# Patient Record
Sex: Female | Born: 1990 | Race: Black or African American | Hispanic: No | Marital: Single
Health system: Midwestern US, Community
[De-identification: ages and names within clinical notes are randomized; demographics above are authoritative.]

## PROBLEM LIST (undated history)

## (undated) DIAGNOSIS — Z789 Other specified health status: Secondary | ICD-10-CM

## (undated) DIAGNOSIS — O4700 False labor before 37 completed weeks of gestation, unspecified trimester: Secondary | ICD-10-CM

## (undated) HISTORY — PX: TONSILLECTOMY: SUR1361

## (undated) MED ORDER — HYDROCODONE-ACETAMINOPHEN 5 MG-325 MG TAB
5-325 mg | ORAL_TABLET | ORAL | Status: DC | PRN
Start: ? — End: 2013-09-01

## (undated) MED ORDER — PROMETHAZINE 12.5 MG TAB
12.5 mg | ORAL_TABLET | Freq: Four times a day (QID) | ORAL | Status: DC | PRN
Start: ? — End: 2012-12-19

## (undated) MED ORDER — HYDROCODONE-ACETAMINOPHEN 7.5 MG-325 MG TAB
ORAL_TABLET | ORAL | Status: DC | PRN
Start: ? — End: 2013-06-03

## (undated) MED ORDER — METRONIDAZOLE 500 MG TAB
500 mg | ORAL_TABLET | Freq: Two times a day (BID) | ORAL | Status: AC
Start: ? — End: 2012-08-02

## (undated) MED ORDER — IBUPROFEN 400 MG TAB
400 mg | ORAL_TABLET | ORAL | Status: DC | PRN
Start: ? — End: 2013-06-03

## (undated) MED ORDER — CEPHALEXIN 500 MG CAP
500 mg | ORAL_CAPSULE | Freq: Four times a day (QID) | ORAL | Status: AC
Start: ? — End: 2012-08-02

## (undated) MED ORDER — PROMETHAZINE 25 MG RECTAL SUPPOSITORY
25 mg | Freq: Four times a day (QID) | RECTAL | Status: AC | PRN
Start: ? — End: 2012-07-12

## (undated) MED ORDER — TRAMADOL 50 MG TAB
50 mg | ORAL_TABLET | Freq: Four times a day (QID) | ORAL | Status: DC | PRN
Start: ? — End: 2014-10-03

## (undated) MED ORDER — NITROFURANTOIN (25% MACROCRYSTAL FORM) 100 MG CAP
100 mg | ORAL_CAPSULE | Freq: Two times a day (BID) | ORAL | Status: DC
Start: ? — End: 2012-07-26

## (undated) MED ORDER — NITROFURANTOIN (25% MACROCRYSTAL FORM) 100 MG CAP
100 mg | ORAL_CAPSULE | Freq: Two times a day (BID) | ORAL | Status: AC
Start: ? — End: 2013-09-04

## (undated) MED ORDER — NITROFURANTOIN (25% MACROCRYSTAL FORM) 100 MG CAP
100 mg | ORAL_CAPSULE | Freq: Two times a day (BID) | ORAL | Status: AC
Start: ? — End: 2012-09-08

---

## 2012-04-24 LAB — METABOLIC PANEL, COMPREHENSIVE
A-G Ratio: 1 — ABNORMAL LOW (ref 1.2–3.5)
ALT (SGPT): 18 U/L (ref 12–65)
AST (SGOT): 15 U/L (ref 15–37)
Albumin: 3.5 g/dL (ref 3.5–5.0)
Alk. phosphatase: 95 U/L (ref 50–136)
Anion gap: 7 mmol/L (ref 7–16)
BUN: 8 MG/DL (ref 6–23)
Bilirubin, total: 0.4 MG/DL (ref 0.2–1.1)
CO2: 29 MMOL/L (ref 21–32)
Calcium: 8.6 MG/DL (ref 8.3–10.4)
Chloride: 106 MMOL/L (ref 98–107)
Creatinine: 1 MG/DL (ref 0.6–1.0)
GFR est AA: >60 mL/min/{1.73_m2} (ref >60)
GFR est non-AA: >60 mL/min/{1.73_m2} (ref >60)
Globulin: 3.6 g/dL — ABNORMAL HIGH (ref 2.3–3.5)
Glucose: 85 MG/DL (ref 65–100)
Potassium: 3.7 MMOL/L (ref 3.5–5.1)
Protein, total: 7.1 g/dL (ref 6.3–8.2)
Sodium: 142 MMOL/L (ref 136–145)

## 2012-04-24 LAB — CBC WITH AUTOMATED DIFF
ABS. BASOPHILS: 0 10*3/uL (ref 0.0–0.2)
ABS. EOSINOPHILS: 0.1 10*3/uL (ref 0.0–0.8)
ABS. IMM. GRANS.: 0 10*3/uL (ref 0.0–0.5)
ABS. LYMPHOCYTES: 1.2 10*3/uL (ref 0.5–4.6)
ABS. MONOCYTES: 0.6 10*3/uL (ref 0.1–1.3)
ABS. NEUTROPHILS: 9.3 10*3/uL — ABNORMAL HIGH (ref 1.7–8.2)
BASOPHILS: 0 % (ref 0.0–2.0)
EOSINOPHILS: 1 % (ref 0.5–7.8)
HCT: 42.6 % (ref 35.8–46.3)
HGB: 14.3 g/dL (ref 11.7–15.4)
IMMATURE GRANULOCYTES: 0.1 % (ref 0.0–5.0)
LYMPHOCYTES: 11 % — ABNORMAL LOW (ref 13–44)
MCH: 28.4 PG (ref 26.1–32.9)
MCHC: 33.6 g/dL (ref 31.4–35.0)
MCV: 84.7 FL (ref 79.6–97.8)
MONOCYTES: 5 % (ref 4.0–12.0)
MPV: 10.3 FL — ABNORMAL LOW (ref 10.8–14.1)
NEUTROPHILS: 83 % — ABNORMAL HIGH (ref 43–78)
PLATELET: 184 10*3/uL (ref 150–450)
RBC: 5.03 M/uL (ref 4.05–5.25)
RDW: 12.9 % (ref 11.9–14.6)
WBC: 11.2 10*3/uL — ABNORMAL HIGH (ref 4.3–11.1)

## 2012-04-24 LAB — C REACTIVE PROTEIN, QT: C-Reactive protein: 0.6 MG/DL (ref 0.0–0.9)

## 2012-04-24 LAB — WET PREP: Wet prep: 0

## 2012-04-24 LAB — HCG URINE, QL. - POC: Pregnancy test,urine (POC): NEGATIVE

## 2012-04-24 MED ORDER — METRONIDAZOLE 500 MG TAB
500 mg | ORAL | Status: AC
Start: 2012-04-24 — End: 2012-04-24
  Administered 2012-04-24: 16:00:00 via ORAL

## 2012-04-24 MED ORDER — SODIUM CHLORIDE 0.9% BOLUS IV
0.9 % | Freq: Once | INTRAVENOUS | Status: AC
Start: 2012-04-24 — End: 2012-04-24
  Administered 2012-04-24: 14:00:00 via INTRAVENOUS

## 2012-04-24 MED ORDER — KETOROLAC TROMETHAMINE 30 MG/ML INJECTION
30 mg/mL (1 mL) | INTRAMUSCULAR | Status: AC
Start: 2012-04-24 — End: 2012-04-24
  Administered 2012-04-24: 14:00:00 via INTRAVENOUS

## 2012-04-24 MED FILL — METRONIDAZOLE 500 MG TAB: 500 mg | ORAL | Qty: 4

## 2012-04-24 MED FILL — KETOROLAC TROMETHAMINE 30 MG/ML INJECTION: 30 mg/mL (1 mL) | INTRAMUSCULAR | Qty: 1

## 2012-04-24 NOTE — ED Provider Notes (Signed)
I was personally available for consultation in the emergency department.  I have reviewed the chart and agree with the documentation recorded by the MLP, including the assessment, treatment plan, and disposition.  Kateland Leisinger M Ryleigh Buenger, MD

## 2012-04-24 NOTE — ED Notes (Signed)
Pt discharged ambulatory to home. Pt teaching rt discharge and followup instructions provided. Pt voiced understanding.

## 2012-04-24 NOTE — ED Notes (Signed)
Pt states her pain has decreased to 3/10, family is at bedside.

## 2012-04-24 NOTE — ED Notes (Signed)
Pt wheeled to room by tech, hyperventilating c/o pain   Female w/ her and pt's minor child

## 2012-04-24 NOTE — ED Provider Notes (Signed)
HPI Comments: Pt is a 21 yo female who c/o lower suprapubic pain that started last night that has been sharp and intermittent in nature. Denies urinary frequency, hesitancy, urgency, or hematuria. States that she was 'up all night' vomiting. Denies fever. LMP was end of May. Denies other symptoms.        pmhx - tonsillectomy  shx - none    Patient is a 21 y.o. female presenting with abdominal pain. The history is provided by the patient.   Abdominal Pain   This is a new problem. The current episode started 6 to 12 hours ago. The problem occurs constantly. The problem has not changed since onset.The pain is located in the suprapubic region. The quality of the pain is sharp. The pain is at a severity of 10/10. Associated symptoms include nausea and vomiting. Pertinent negatives include no anorexia, no fever, no belching, no diarrhea, no flatus, no hematochezia, no melena, no constipation, no dysuria, no frequency, no hematuria, no headaches, no arthralgias, no myalgias, no trauma, no chest pain, no testicular pain and no back pain.        History reviewed. No pertinent past medical history.     Past Surgical History   Procedure Date   ??? Hx heent      tonsils         History reviewed. No pertinent family history.     History     Social History   ??? Marital Status: SINGLE     Spouse Name: N/A     Number of Children: N/A   ??? Years of Education: N/A     Occupational History   ??? Not on file.     Social History Main Topics   ??? Smoking status: Not on file   ??? Smokeless tobacco: Not on file   ??? Alcohol Use: No   ??? Drug Use: No   ??? Sexually Active: Yes -- Female partner(s)     Birth Control/ Protection: None     Other Topics Concern   ??? Not on file     Social History Narrative   ??? No narrative on file                  ALLERGIES: Review of patient's allergies indicates no known allergies.      Review of Systems   Constitutional: Negative.  Negative for fever, diaphoresis and unexpected weight change.   Respiratory: Negative for  cough and shortness of breath.    Cardiovascular: Negative for chest pain and palpitations.   Gastrointestinal: Positive for nausea, vomiting and abdominal pain. Negative for diarrhea, constipation, melena, hematochezia, anorexia and flatus.   Genitourinary: Negative for dysuria, frequency, hematuria and testicular pain.   Musculoskeletal: Negative for myalgias, back pain and arthralgias.   Skin: Negative for color change and pallor.   Neurological: Negative for headaches.   Psychiatric/Behavioral: Negative.  Negative for behavioral problems, confusion, self-injury and agitation.   All other systems reviewed and are negative.        Filed Vitals:    04/24/12 0922   BP: 124/59   Pulse: 64   Temp: 98.1 ??F (36.7 ??C)   Resp: 18   Height: 5\' 9"  (1.753 m)   Weight: 90.719 kg (200 lb)   SpO2: 98%            Physical Exam   Nursing note and vitals reviewed.  Constitutional: She is oriented to person, place, and time. She appears well-developed and well-nourished.  Non-toxic  appearance. She does not have a sickly appearance. She does not appear ill. No distress.   HENT:   Head: Normocephalic and atraumatic.   Eyes: Conjunctivae and EOM are normal. Pupils are equal, round, and reactive to light.   Neck: Normal range of motion. Neck supple.   Cardiovascular: Normal rate, regular rhythm and normal heart sounds.  PMI is not displaced.  Exam reveals no gallop.    No murmur heard.  Pulmonary/Chest: Effort normal and breath sounds normal. No respiratory distress. She has no decreased breath sounds. She has no wheezes. She has no rhonchi. She has no rales.   Abdominal: Soft. Normal appearance and bowel sounds are normal. She exhibits no distension, no fluid wave, no ascites and no mass. There is tenderness in the suprapubic area. There is no rigidity, no rebound, no guarding, no tenderness at McBurney's point and negative Murphy's sign.       Musculoskeletal: Normal range of motion. She exhibits no edema and no tenderness.    Neurological: She is alert and oriented to person, place, and time.   Skin: Skin is warm and dry. She is not diaphoretic.   Psychiatric: She has a normal mood and affect. Her behavior is normal. Judgment and thought content normal.        MDM    Procedures    Assessment: bv  Plan: flagyl. Fluids. Ibuprofen. Fu with pcp, return if s.sxs worsen or change or new symptoms develop./ no alcohol for 72 hours after taking flagyle. Pt in agreement with assessment and plan.    I have discussed the results of labs, procedures, radiographs, treatments as well as any previous results found within the [facility] [employer] with the patient and available family.?? A treatment plan was developed in conjunction with the patient and was agreed upon. The patient is ready for discharge at this time.?? All voiced understanding of the discharge plan and medication instructions or changes as appropriate.?? Questions about treatment in the ED were answered.?? The patient was encouraged to return should symptoms worsen or new problems develop. A follow up physician was provided to the patient on the discharge papers.

## 2012-04-24 NOTE — ED Notes (Cosign Needed)
Pt seen by Dr. Cyndie Chime. Awaiting wet prep results

## 2012-04-24 NOTE — ED Notes (Signed)
I have reviewed discharge instructions with the patient.  The patient verbalized understanding.

## 2012-04-24 NOTE — ED Notes (Signed)
Iv fluids hung, iv med given, room set up for pelvic exam

## 2012-04-24 NOTE — ED Notes (Signed)
Onset of lower abd pain last evening assoc with some vomiting. Mother stated pt has similar episode of pain at age of 17 that did not show any cause of pain. They wanted to do exploratory surgery but she would now allow. Pt did not return. Pt is feeling better. abd soft non tender.Marland Kitchen

## 2012-04-24 NOTE — ED Notes (Signed)
Pt c/o continued nausea, restless, hyperventilating. Mother comforting pt

## 2012-04-24 NOTE — ED Notes (Cosign Needed)
Pt is a 21 yo female who c/o lower suprapubic pain that started last night that has been sharp and intermittent in nature. Denies urinary frequency, hesitancy, urgency, or hematuria. States that she was 'up all night' vomiting. Denies fever. LMP was end of May. Denies other symptoms.        abd exam: soft abdomen with no guarding, rigidity, rebound, distention. NBS in all quadrants.

## 2012-04-24 NOTE — ED Notes (Signed)
Pt was actively hyperventilating upon arrival. She had associated carpopedal spams. They have resolved. No further vomiting.

## 2012-04-24 NOTE — ED Notes (Cosign Needed)
Results Include:    Recent Results (from the past 24 hour(s))   HCG URINE, QL. - POC    Collection Time    04/24/12  9:32 AM       Component Value Range    Pregnancy test,urine (POC) NEGATIVE   NEGATIVE   CBC WITH AUTOMATED DIFF    Collection Time    04/24/12 10:07 AM       Component Value Range    WBC 11.2 (*) 4.3 - 11.1 K/uL    RBC 5.03  4.05 - 5.25 M/uL    HGB 14.3  11.7 - 15.4 g/dL    HCT 16.1  09.6 - 04.5 %    MCV 84.7  79.6 - 97.8 FL    MCH 28.4  26.1 - 32.9 PG    MCHC 33.6  31.4 - 35.0 g/dL    RDW 40.9  81.1 - 91.4 %    PLATELET 184  150 - 450 K/uL    MPV 10.3 (*) 10.8 - 14.1 FL    DF AUTOMATED      NEUTROPHILS 83 (*) 43 - 78 %    LYMPHOCYTES 11 (*) 13 - 44 %    MONOCYTES 5  4.0 - 12.0 %    EOSINOPHILS 1  0.5 - 7.8 %    BASOPHILS 0  0.0 - 2.0 %    IMMATURE GRANULOCYTES 0.1  0.0 - 5.0 %    ABS. NEUTROPHILS 9.3 (*) 1.7 - 8.2 K/UL    ABS. LYMPHOCYTES 1.2  0.5 - 4.6 K/UL    ABS. MONOCYTES 0.6  0.1 - 1.3 K/UL    ABS. EOSINOPHILS 0.1  0.0 - 0.8 K/UL    ABS. BASOPHILS 0.0  0.0 - 0.2 K/UL    ABS. IMM. GRANS. 0.0  0.0 - 0.5 K/UL   METABOLIC PANEL, COMPREHENSIVE    Collection Time    04/24/12 10:07 AM       Component Value Range    Sodium 142  136 - 145 MMOL/L    Potassium 3.7  3.5 - 5.1 MMOL/L    Chloride 106  98 - 107 MMOL/L    CO2 29  21 - 32 MMOL/L    Anion gap 7  7 - 16 mmol/L    Glucose 85  65 - 100 MG/DL    BUN 8  6 - 23 MG/DL    Creatinine 7.82  0.6 - 1.0 MG/DL    GFR est-AA >95  >62 ml/min/1.49m2    GFR est non-AA >60  >60 ml/min/1.34m2    Calcium 8.6  8.3 - 10.4 MG/DL    Bilirubin, total 0.4  0.2 - 1.1 MG/DL    ALT 18  12 - 65 U/L    AST 15  15 - 37 U/L    Alk. phosphatase 95  50 - 136 U/L    Protein, total 7.1  6.3 - 8.2 g/dL    Albumin 3.5  3.5 - 5.0 g/dL    Globulin 3.6 (*) 2.3 - 3.5 g/dL    A-G Ratio 1.0 (*) 1.2 - 3.5     C REACTIVE PROTEIN, QT    Collection Time    04/24/12 10:07 AM       Component Value Range    C-Reactive protein 0.6  0.0 - 0.9 MG/DL   WET PREP    Collection Time    04/24/12 11:23 AM        Component Value Range    Specimen Description: VAGINA  Special Requests: NO SPECIAL REQUESTS      Wet prep        Value: 0 TO 3 WBC      NO YEAST SEEN      NO TRICHOMONAS SEEN      FEW CLUE CELLS PRESENT    Report Status 04/24/2012 FINAL            Assessment: BV  Plan: metronidazole. Water. Fu with pcp. Return if s.sxs worsen or change.      I have discussed the results of labs, procedures, radiographs, treatments as well as any previous results found within the [facility] [employer] with the patient and available family.?? A treatment plan was developed in conjunction with the patient and was agreed upon. The patient is ready for discharge at this time.?? All voiced understanding of the discharge plan and medication instructions or changes as appropriate.?? Questions about treatment in the ED were answered.?? The patient was encouraged to return should symptoms worsen or new problems develop. A follow up physician was provided to the patient on the discharge papers.

## 2012-04-24 NOTE — ED Notes (Signed)
Seen here today diagnosed with PID put on flagyl and vomited. Pt hyperventallating and hands cramping.

## 2012-04-24 NOTE — ED Provider Notes (Signed)
Patient is a 21 y.o. female presenting with numbness. The history is provided by the patient and medical records.   Numbness  This is a new problem. The current episode started 1 to 2 hours ago. The problem has not changed since onset.There was no focality noted. There has been no fever.    Pt was seen here earlier today for pelvic pain, diagnosed with BV and given 2grams of flagyl x1. She was discharged home and several hours later vomited. She was brought back to the ED hyperventilating and anxious. No further vomiting.     History reviewed. No pertinent past medical history.     Past Surgical History   Procedure Date   ??? Hx heent      tonsils         History reviewed. No pertinent family history.     History     Social History   ??? Marital Status: SINGLE     Spouse Name: N/A     Number of Children: N/A   ??? Years of Education: N/A     Occupational History   ??? Not on file.     Social History Main Topics   ??? Smoking status: Not on file   ??? Smokeless tobacco: Not on file   ??? Alcohol Use: No   ??? Drug Use: No   ??? Sexually Active: Yes -- Female partner(s)     Birth Control/ Protection: None     Other Topics Concern   ??? Not on file     Social History Narrative   ??? No narrative on file                  ALLERGIES: Review of patient's allergies indicates no known allergies.      Review of Systems   Neurological: Positive for numbness.   All other systems reviewed and are negative.        Filed Vitals:    04/24/12 2234 04/24/12 2242 04/24/12 2250 04/24/12 2306   BP:   133/66    Pulse:       Temp:       Resp:   32    Height:       Weight:       SpO2: 100% 100% 100% 100%            Physical Exam   Nursing note and vitals reviewed.  Constitutional: She is oriented to person, place, and time. She appears well-developed and well-nourished.        .Blood pressure 133/66, pulse 80, temperature 96.9 ??F (36.1 ??C), resp. rate 32, height 5\' 10"  (1.778 m), weight 90.719 kg (200 lb), last menstrual period 04/02/2012, SpO2 100.00%.        HENT:   Head: Normocephalic.   Mouth/Throat: Oropharynx is clear and moist.   Eyes: Conjunctivae are normal.   Neck: Neck supple.   Cardiovascular: Normal rate, regular rhythm, normal heart sounds and intact distal pulses.    Pulmonary/Chest: Effort normal and breath sounds normal.   Abdominal: Soft. Bowel sounds are normal. There is no tenderness.   Musculoskeletal: Normal range of motion.   Neurological: She is alert and oriented to person, place, and time.   Skin: Skin is warm and dry.        MDM    Procedures

## 2012-04-25 MED ORDER — ONDANSETRON 4 MG TAB, RAPID DISSOLVE
4 mg | ORAL | Status: AC
Start: 2012-04-25 — End: 2012-04-24
  Administered 2012-04-25: 03:00:00 via ORAL

## 2012-04-25 MED ORDER — ONDANSETRON 4 MG TAB, RAPID DISSOLVE
4 mg | ORAL_TABLET | Freq: Three times a day (TID) | ORAL | Status: DC | PRN
Start: 2012-04-25 — End: 2012-06-24

## 2012-04-25 MED ORDER — METRONIDAZOLE 500 MG TAB
500 mg | ORAL_TABLET | Freq: Two times a day (BID) | ORAL | Status: AC
Start: 2012-04-25 — End: 2012-04-27

## 2012-04-25 MED FILL — ONDANSETRON 4 MG TAB, RAPID DISSOLVE: 4 mg | ORAL | Qty: 1

## 2012-04-26 LAB — CHLAMYDIA / GC-AMPLIFIED
Chlamydia trachomatis, NAA: NEGATIVE
Neisseria gonorrhoeae, NAA: NEGATIVE

## 2012-06-24 LAB — HCG URINE, QL. - POC: Pregnancy test,urine (POC): NEGATIVE

## 2012-06-24 MED ORDER — ONDANSETRON 4 MG TAB, RAPID DISSOLVE
4 mg | ORAL_TABLET | Freq: Three times a day (TID) | ORAL | Status: DC | PRN
Start: 2012-06-24 — End: 2012-07-18

## 2012-06-24 MED ORDER — TRAMADOL 50 MG TAB
50 mg | ORAL_TABLET | Freq: Four times a day (QID) | ORAL | Status: DC | PRN
Start: 2012-06-24 — End: 2012-07-18

## 2012-06-24 MED ADMIN — sodium chloride 0.9 % bolus infusion 1,000 mL: INTRAVENOUS | @ 21:00:00 | NDC 00409798309

## 2012-06-24 MED ADMIN — ondansetron (ZOFRAN) injection 4 mg: INTRAVENOUS | @ 21:00:00 | NDC 00409475503

## 2012-06-24 MED ADMIN — HYDROcodone-acetaminophen (NORCO) 7.5-325 mg per tablet 1 Tab: ORAL | @ 21:00:00 | NDC 00406036662

## 2012-06-24 MED FILL — ONDANSETRON (PF) 4 MG/2 ML INJECTION: 4 mg/2 mL | INTRAMUSCULAR | Qty: 2

## 2012-06-24 MED FILL — HYDROCODONE-ACETAMINOPHEN 7.5 MG-325 MG TAB: ORAL | Qty: 1

## 2012-06-24 NOTE — ED Provider Notes (Signed)
HPI Comments: Pt c/o nausea and generalized abdominal pain after having too much ETOH last night.      Patient is a 21 y.o. female presenting with abdominal pain. The history is provided by the patient.   Abdominal Pain   This is a new problem. The current episode started 12 to 24 hours ago. The problem occurs constantly. The problem has not changed since onset.The pain is associated with vomiting. The pain is located in the generalized abdominal region. The quality of the pain is cramping. The pain is moderate. Associated symptoms include nausea, vomiting and headaches. Pertinent negatives include no fever, no diarrhea, no dysuria, no frequency and no chest pain. Nothing worsens the pain. The pain is relieved by nothing.        No past medical history on file.     Past Surgical History   Procedure Date   ??? Hx heent      tonsils         No family history on file.     History     Social History   ??? Marital Status: SINGLE     Spouse Name: N/A     Number of Children: N/A   ??? Years of Education: N/A     Occupational History   ??? Not on file.     Social History Main Topics   ??? Smoking status: Current Everyday Smoker -- 0.5 packs/day   ??? Smokeless tobacco: Not on file   ??? Alcohol Use: Yes      occ   ??? Drug Use: No   ??? Sexually Active: Yes -- Female partner(s)     Birth Control/ Protection: None     Other Topics Concern   ??? Not on file     Social History Narrative   ??? No narrative on file                  ALLERGIES: Review of patient's allergies indicates no known allergies.      Review of Systems   Constitutional: Positive for activity change and appetite change. Negative for fever, chills and diaphoresis.   Cardiovascular: Negative for chest pain and palpitations.   Gastrointestinal: Positive for nausea, vomiting and abdominal pain. Negative for diarrhea.   Genitourinary: Negative for dysuria, frequency, flank pain, vaginal discharge and pelvic pain.   Neurological: Positive for headaches. Negative for dizziness.        Filed Vitals:    06/24/12 1526   BP: 127/70   Pulse: 59   Temp: 98 ??F (36.7 ??C)   Resp: 18   Height: 5\' 9"  (1.753 m)   Weight: 90.719 kg (200 lb)   SpO2: 98%            Physical Exam   Vitals reviewed.  Constitutional: She is oriented to person, place, and time. She appears well-developed and well-nourished. No distress.   HENT:   Head: Normocephalic and atraumatic.   Eyes: Conjunctivae are normal. Pupils are equal, round, and reactive to light.   Neck: Normal range of motion. Neck supple.   Cardiovascular: Regular rhythm and normal heart sounds.    No murmur heard.  Pulmonary/Chest: Effort normal and breath sounds normal. No respiratory distress. She has no wheezes. She has no rales.   Abdominal: Soft. Normal appearance and bowel sounds are normal. She exhibits no distension and no mass. There is no hepatosplenomegaly. There is generalized tenderness. There is no rigidity, no rebound, no guarding, no CVA tenderness, no tenderness at  McBurney's point and negative Murphy's sign. No hernia.   Neurological: She is alert and oriented to person, place, and time.   Skin: Skin is warm and dry. She is not diaphoretic.   Psychiatric: She has a normal mood and affect.        MDM     Progress:   Patient progress:  Improved (Pt states she feels significantly better after treatment.  )      Procedures    Results Include:    Recent Results (from the past 24 hour(s))   HCG URINE, QL. - POC    Collection Time    06/24/12  3:35 PM       Component Value Range    Pregnancy test,urine (POC) NEGATIVE   NEGATIVE      I have discussed the exam and treatments with the patient and available family.?? A treatment plan was developed in conjunction with the patient and was agreed upon. The patient is ready for discharge at this time.?? All voiced understanding of the discharge plan and medication instructions or changes as appropriate.?? Questions about treatment in the ED were answered.?? The patient was encouraged to return should symptoms  worsen or new problems develop. A follow up physician was provided to the patient on the discharge papers.

## 2012-06-24 NOTE — ED Notes (Signed)
Dc with rx and dc instructions; reviewed with pt; sts understanding. Left with so.

## 2012-06-24 NOTE — ED Notes (Signed)
Pt sts diarrhea onset this am.

## 2012-06-24 NOTE — ED Provider Notes (Signed)
I was personally available for consultation in the emergency department.  I have reviewed the chart and agree with the documentation recorded by the MLP, including the assessment, treatment plan, and disposition.  Rudie Rikard S Dial III, MD

## 2012-07-01 LAB — URINE MICROSCOPIC
Casts: 0 /LPF
Crystals, urine: 0 /LPF
Mucus: 0 /LPF

## 2012-07-01 LAB — CBC WITH AUTOMATED DIFF
ABS. BASOPHILS: 0 10*3/uL (ref 0.0–0.2)
ABS. EOSINOPHILS: 0 10*3/uL (ref 0.0–0.8)
ABS. IMM. GRANS.: 0 10*3/uL (ref 0.0–0.5)
ABS. LYMPHOCYTES: 1.2 10*3/uL (ref 0.5–4.6)
ABS. MONOCYTES: 0.4 10*3/uL (ref 0.1–1.3)
ABS. NEUTROPHILS: 8.7 10*3/uL — ABNORMAL HIGH (ref 1.7–8.2)
BASOPHILS: 0 % (ref 0.0–2.0)
EOSINOPHILS: 0 % — ABNORMAL LOW (ref 0.5–7.8)
HCT: 39.3 % (ref 35.8–46.3)
HGB: 13.5 g/dL (ref 11.7–15.4)
IMMATURE GRANULOCYTES: 0.3 % (ref 0.0–5.0)
LYMPHOCYTES: 12 % — ABNORMAL LOW (ref 13–44)
MCH: 28.6 PG (ref 26.1–32.9)
MCHC: 34.4 g/dL (ref 31.4–35.0)
MCV: 83.3 FL (ref 79.6–97.8)
MONOCYTES: 4 % (ref 4.0–12.0)
MPV: 10.9 FL (ref 10.8–14.1)
NEUTROPHILS: 84 % — ABNORMAL HIGH (ref 43–78)
PLATELET: 189 10*3/uL (ref 150–450)
RBC: 4.72 M/uL (ref 4.05–5.25)
RDW: 13.5 % (ref 11.9–14.6)
WBC: 10.4 10*3/uL (ref 4.3–11.1)

## 2012-07-01 LAB — METABOLIC PANEL, BASIC
Anion gap: 12 mmol/L (ref 7–16)
BUN: 12 MG/DL (ref 6–23)
CO2: 25 MMOL/L (ref 21–32)
Calcium: 8.8 MG/DL (ref 8.3–10.4)
Chloride: 107 MMOL/L (ref 98–107)
Creatinine: 0.9 MG/DL (ref 0.6–1.0)
GFR est AA: >60 mL/min/{1.73_m2} (ref >60)
GFR est non-AA: >60 mL/min/{1.73_m2} (ref >60)
Glucose: 78 MG/DL (ref 65–100)
Potassium: 3.5 MMOL/L (ref 3.5–5.1)
Sodium: 144 MMOL/L (ref 136–145)

## 2012-07-01 LAB — HCG URINE, QL. - POC: Pregnancy test,urine (POC): NEGATIVE

## 2012-07-01 LAB — HCG QL SERUM: HCG, Ql.: NEGATIVE

## 2012-07-01 MED ADMIN — cefTRIAXone (ROCEPHIN) 1 g in 0.9% sodium chloride (MBP/ADV) 50 mL MBP: INTRAVENOUS | @ 16:00:00 | NDC 00781320885

## 2012-07-01 MED ADMIN — ketorolac (TORADOL) injection 30 mg: INTRAVENOUS | @ 16:00:00 | NDC 00409379501

## 2012-07-01 MED ADMIN — sodium chloride 0.9 % bolus infusion 1,000 mL: INTRAVENOUS | @ 16:00:00 | NDC 00409798309

## 2012-07-01 MED FILL — SODIUM CHLORIDE 0.9 % IV PIGGY BACK: INTRAVENOUS | Qty: 50

## 2012-07-01 MED FILL — KETOROLAC TROMETHAMINE 30 MG/ML INJECTION: 30 mg/mL (1 mL) | INTRAMUSCULAR | Qty: 1

## 2012-07-01 NOTE — ED Notes (Signed)
Report given to L Duncan RN

## 2012-07-01 NOTE — ED Notes (Signed)
Pt resting at present friend at bedside,no complaints noted

## 2012-07-01 NOTE — ED Notes (Signed)
Dc with rx and dc instructions; reviewed with pt; sts understanding; left with so.

## 2012-07-01 NOTE — ED Notes (Addendum)
Pt. Feeling better. No c/o voiced.

## 2012-07-01 NOTE — ED Notes (Addendum)
Pt. Wanting to know when she can be d/c'd. MD informed.

## 2012-07-01 NOTE — ED Provider Notes (Addendum)
HPI Comments: Dysuria, dark urine, LA cramps      Patient is a 21 y.o. female presenting with abdominal pain. The history is provided by the patient.   Abdominal Pain   Associated symptoms include dysuria.        History reviewed. No pertinent past medical history.     Past Surgical History   Procedure Date   ??? Hx tonsillectomy          History reviewed. No pertinent family history.     History     Social History   ??? Marital Status: SINGLE     Spouse Name: N/A     Number of Children: N/A   ??? Years of Education: N/A     Occupational History   ??? Not on file.     Social History Main Topics   ??? Smoking status: Current Everyday Smoker -- 0.5 packs/day   ??? Smokeless tobacco: Never Used   ??? Alcohol Use: Yes      occ   ??? Drug Use: No   ??? Sexually Active: Yes -- Female partner(s)     Birth Control/ Protection: None     Other Topics Concern   ??? Not on file     Social History Narrative   ??? No narrative on file                  ALLERGIES: Review of patient's allergies indicates no known allergies.      Review of Systems   Constitutional: Negative.    HENT: Negative.    Respiratory: Negative.    Cardiovascular: Negative.    Gastrointestinal: Positive for abdominal pain.   Genitourinary: Positive for dysuria.   All other systems reviewed and are negative.        Filed Vitals:    07/01/12 1032   BP: 129/79   Pulse: 67   Temp: 98 ??F (36.7 ??C)   Resp: 18   Height: 5\' 9"  (1.753 m)   Weight: 90.719 kg (200 lb)   SpO2: 99%            Physical Exam   Nursing note and vitals reviewed.  Constitutional: She is oriented to person, place, and time. She appears well-developed and well-nourished.   HENT:   Head: Normocephalic and atraumatic.   Eyes: Pupils are equal, round, and reactive to light.   Neck: Normal range of motion. Neck supple.   Cardiovascular: Normal rate and regular rhythm.    Pulmonary/Chest: Effort normal.   Abdominal: Soft. Bowel sounds are normal. She exhibits no distension and no mass. There is no rebound and no guarding.    Musculoskeletal: Normal range of motion.   Neurological: She is alert and oriented to person, place, and time.   Skin: Skin is warm and dry.        MDM     Differential Diagnosis; Clinical Impression; Plan:     Pt feels better in ED          Procedures

## 2012-07-01 NOTE — ED Notes (Signed)
Pt. Sleeping soundly. Boyfriend at bedside.

## 2012-07-01 NOTE — ED Notes (Signed)
Md to room

## 2012-07-04 NOTE — ED Notes (Signed)
PIV initiated, rainbow to lab. Patient unable to provide urine for specimen at this time. IVF infusing with zofran given per Zellner's direct order.

## 2012-07-04 NOTE — ED Notes (Signed)
Patient presents to ED with c/c NVD and chills. Patient reports onset of NVD on Saturday. Patient was diagnosed with UTI though reports she cannot keep her meds down. Patient reports increasing vomiting with small red blood specs in vomit.

## 2012-07-05 LAB — METABOLIC PANEL, COMPREHENSIVE
A-G Ratio: 1 — ABNORMAL LOW (ref 1.2–3.5)
ALT (SGPT): 24 U/L (ref 12–65)
AST (SGOT): 24 U/L (ref 15–37)
Albumin: 3.6 g/dL (ref 3.5–5.0)
Alk. phosphatase: 73 U/L (ref 50–136)
Anion gap: 9 mmol/L (ref 7–16)
BUN: 9 MG/DL (ref 6–23)
Bilirubin, total: 0.5 MG/DL (ref 0.2–1.1)
CO2: 25 MMOL/L (ref 21–32)
Calcium: 8.9 MG/DL (ref 8.3–10.4)
Chloride: 110 MMOL/L — ABNORMAL HIGH (ref 98–107)
Creatinine: 0.8 MG/DL (ref 0.6–1.0)
GFR est AA: >60 mL/min/{1.73_m2} (ref >60)
GFR est non-AA: >60 mL/min/{1.73_m2} (ref >60)
Globulin: 3.6 g/dL — ABNORMAL HIGH (ref 2.3–3.5)
Glucose: 81 MG/DL (ref 65–100)
Potassium: 3.4 MMOL/L — ABNORMAL LOW (ref 3.5–5.1)
Protein, total: 7.2 g/dL (ref 6.3–8.2)
Sodium: 144 MMOL/L (ref 136–145)

## 2012-07-05 LAB — CBC WITH AUTOMATED DIFF
ABS. BASOPHILS: 0 10*3/uL (ref 0.0–0.2)
ABS. EOSINOPHILS: 0.1 10*3/uL (ref 0.0–0.8)
ABS. IMM. GRANS.: 0 10*3/uL (ref 0.0–0.5)
ABS. LYMPHOCYTES: 2.5 10*3/uL (ref 0.5–4.6)
ABS. MONOCYTES: 0.7 10*3/uL (ref 0.1–1.3)
ABS. NEUTROPHILS: 5.5 10*3/uL (ref 1.7–8.2)
BASOPHILS: 1 % (ref 0.0–2.0)
EOSINOPHILS: 1 % (ref 0.5–7.8)
HCT: 40.4 % (ref 35.8–46.3)
HGB: 13.9 g/dL (ref 11.7–15.4)
IMMATURE GRANULOCYTES: 0.1 % (ref 0.0–5.0)
LYMPHOCYTES: 28 % (ref 13–44)
MCH: 28.7 PG (ref 26.1–32.9)
MCHC: 34.4 g/dL (ref 31.4–35.0)
MCV: 83.5 FL (ref 79.6–97.8)
MONOCYTES: 8 % (ref 4.0–12.0)
MPV: 11 FL (ref 10.8–14.1)
NEUTROPHILS: 62 % (ref 43–78)
PLATELET: 179 10*3/uL (ref 150–450)
RBC: 4.84 M/uL (ref 4.05–5.25)
RDW: 13.4 % (ref 11.9–14.6)
WBC: 8.8 10*3/uL (ref 4.3–11.1)

## 2012-07-05 LAB — HCG URINE, QL. - POC: Pregnancy test,urine (POC): NEGATIVE

## 2012-07-05 MED ADMIN — sodium chloride 0.9 % bolus infusion 1,000 mL: INTRAVENOUS | @ 04:00:00 | NDC 00409798309

## 2012-07-05 MED ADMIN — sodium chloride 0.9 % bolus infusion 1,000 mL: INTRAVENOUS | @ 03:00:00 | NDC 00409798309

## 2012-07-05 MED ADMIN — ondansetron (ZOFRAN) injection 4 mg: INTRAVENOUS | @ 03:00:00 | NDC 00781301095

## 2012-07-05 MED ADMIN — promethazine (PHENERGAN) injection 25 mg: INTRAVENOUS | @ 04:00:00 | NDC 00641092821

## 2012-07-05 MED FILL — ONDANSETRON (PF) 4 MG/2 ML INJECTION: 4 mg/2 mL | INTRAMUSCULAR | Qty: 2

## 2012-07-05 MED FILL — PROMETHAZINE 25 MG/ML INJECTION: 25 mg/mL | INTRAMUSCULAR | Qty: 1

## 2012-07-05 NOTE — ED Provider Notes (Addendum)
Patient is a 21 y.o. female presenting with vomiting. The history is provided by the patient.   Vomiting   This is a new problem. The current episode started more than 1 week ago. The problem occurs 2 to 4 times per day. The problem has not changed since onset.The emesis has an appearance of stomach contents. There has been no fever. Pertinent negatives include no chills, no abdominal pain, no headaches and no headaches.        No past medical history on file.     Past Surgical History   Procedure Date   ??? Hx tonsillectomy          No family history on file.     History     Social History   ??? Marital Status: SINGLE     Spouse Name: N/A     Number of Children: N/A   ??? Years of Education: N/A     Occupational History   ??? Not on file.     Social History Main Topics   ??? Smoking status: Current Everyday Smoker -- 0.5 packs/day   ??? Smokeless tobacco: Never Used   ??? Alcohol Use: Yes      occ   ??? Drug Use: No   ??? Sexually Active: Yes -- Female partner(s)     Birth Control/ Protection: None     Other Topics Concern   ??? Not on file     Social History Narrative   ??? No narrative on file                  ALLERGIES: Review of patient's allergies indicates no known allergies.      Review of Systems   Constitutional: Negative for chills, diaphoresis and fatigue.   HENT: Negative for neck pain and neck stiffness.    Eyes: Negative for photophobia and visual disturbance.   Respiratory: Negative for chest tightness, shortness of breath and stridor.    Cardiovascular: Negative for palpitations and leg swelling.   Gastrointestinal: Positive for vomiting. Negative for abdominal pain.   Genitourinary: Negative for dysuria and flank pain.   Musculoskeletal: Negative for back pain and gait problem.   Skin: Negative for pallor and rash.   Neurological: Negative for light-headedness and headaches.   Hematological: Negative for adenopathy. Does not bruise/bleed easily.   Psychiatric/Behavioral: Negative for behavioral problems and confusion.        Filed Vitals:    07/04/12 2223   BP: 123/64   Pulse: 66   Temp: 97.8 ??F (36.6 ??C)   Resp: 20   Height: 5\' 9"  (1.753 m)   Weight: 90.719 kg (200 lb)   SpO2: 99%            Physical Exam   Nursing note and vitals reviewed.  Constitutional: She is oriented to person, place, and time. She appears well-developed and well-nourished. No distress.   HENT:   Head: Normocephalic and atraumatic.   Mouth/Throat: Oropharynx is clear and moist. No oropharyngeal exudate.   Eyes: Conjunctivae and EOM are normal. Pupils are equal, round, and reactive to light. No scleral icterus.   Neck: Normal range of motion. Neck supple. No JVD present. No tracheal deviation present. No thyromegaly present.   Cardiovascular: Normal rate, regular rhythm, normal heart sounds and intact distal pulses.  Exam reveals no gallop and no friction rub.    No murmur heard.  Pulmonary/Chest: Effort normal and breath sounds normal. No stridor. No respiratory distress. She has no wheezes. She  has no rales. She exhibits no tenderness.   Abdominal: Soft. Bowel sounds are normal. She exhibits no distension and no mass. There is no tenderness. There is no rebound and no guarding.   Musculoskeletal: Normal range of motion. She exhibits no edema and no tenderness.   Lymphadenopathy:     She has no cervical adenopathy.   Neurological: She is alert and oriented to person, place, and time. She has normal reflexes. She displays normal reflexes. No cranial nerve deficit. She exhibits normal muscle tone. Coordination normal.   Skin: Skin is warm and dry. No rash noted. She is not diaphoretic. No erythema. No pallor.   Psychiatric: She has a normal mood and affect. Her behavior is normal. Judgment and thought content normal.        MDM     Differential Diagnosis; Clinical Impression; Plan:     21 yo AAF presents with some continued nausea and vomiting.  She denies any fever.  I will obtain basic labs and urine.  Abdominal exam is benign and I have a low suspicion for  any emergent pathology.  Amount and/or Complexity of Data Reviewed:   Clinical lab tests:  Ordered and reviewed  Tests in the radiology section of CPT??:  Ordered and reviewed  Risk of Significant Complications, Morbidity, and/or Mortality:   Presenting problems:  Low  Diagnostic procedures:  Low  Management options:  Low  Progress:   Patient progress:  Stable      Procedures    Results Include:    Recent Results (from the past 24 hour(s))   CBC WITH AUTOMATED DIFF    Collection Time    07/04/12 10:30 PM       Component Value Range    WBC 8.8  4.3 - 11.1 K/uL    RBC 4.84  4.05 - 5.25 M/uL    HGB 13.9  11.7 - 15.4 g/dL    HCT 16.1  09.6 - 04.5 %    MCV 83.5  79.6 - 97.8 FL    MCH 28.7  26.1 - 32.9 PG    MCHC 34.4  31.4 - 35.0 g/dL    RDW 40.9  81.1 - 91.4 %    PLATELET 179  150 - 450 K/uL    MPV 11.0  10.8 - 14.1 FL    DF AUTOMATED      NEUTROPHILS 62  43 - 78 %    LYMPHOCYTES 28  13 - 44 %    MONOCYTES 8  4.0 - 12.0 %    EOSINOPHILS 1  0.5 - 7.8 %    BASOPHILS 1  0.0 - 2.0 %    IMMATURE GRANULOCYTES 0.1  0.0 - 5.0 %    ABS. NEUTROPHILS 5.5  1.7 - 8.2 K/UL    ABS. LYMPHOCYTES 2.5  0.5 - 4.6 K/UL    ABS. MONOCYTES 0.7  0.1 - 1.3 K/UL    ABS. EOSINOPHILS 0.1  0.0 - 0.8 K/UL    ABS. BASOPHILS 0.0  0.0 - 0.2 K/UL    ABS. IMM. GRANS. 0.0  0.0 - 0.5 K/UL   METABOLIC PANEL, COMPREHENSIVE    Collection Time    07/04/12 10:30 PM       Component Value Range    Sodium 144  136 - 145 MMOL/L    Potassium 3.4 (*) 3.5 - 5.1 MMOL/L    Chloride 110 (*) 98 - 107 MMOL/L    CO2 25  21 - 32 MMOL/L    Anion  gap 9  7 - 16 mmol/L    Glucose 81  65 - 100 MG/DL    BUN 9  6 - 23 MG/DL    Creatinine 1.61  0.6 - 1.0 MG/DL    GFR est-AA >09  >60 ml/min/1.66m2    GFR est non-AA >60  >60 ml/min/1.94m2    Calcium 8.9  8.3 - 10.4 MG/DL    Bilirubin, total 0.5  0.2 - 1.1 MG/DL    ALT 24  12 - 65 U/L    AST 24  15 - 37 U/L    Alk. phosphatase 73  50 - 136 U/L    Protein, total 7.2  6.3 - 8.2 g/dL    Albumin 3.6  3.5 - 5.0 g/dL    Globulin 3.6 (*) 2.3 -  3.5 g/dL    A-G Ratio 1.0 (*) 1.2 - 3.5

## 2012-07-05 NOTE — ED Notes (Signed)
I have reviewed discharge instructions with the patient.  The patient verbalized understanding.

## 2012-07-18 LAB — BLOOD TYPE, (ABO+RH)
ABO/Rh(D): O POS
ABO/Rh: O POS

## 2012-07-18 LAB — BETA HCG, QT
Beta HCG, QT: 1442 m[IU]/mL — ABNORMAL HIGH (ref 0.0–6.0)
hCG Quant: 1442 m[IU]/mL — ABNORMAL HIGH (ref 0.0–6.0)

## 2012-07-18 LAB — URINE MICROSCOPIC
Mucus: 0 /LPF
WBC: >100 /HPF — ABNORMAL HIGH

## 2012-07-18 LAB — HCG URINE, QL. - POC: Pregnancy test,urine (POC): POSITIVE — AB

## 2012-07-18 MED ADMIN — acetaminophen (TYLENOL) tablet 650 mg: ORAL | @ 19:00:00 | NDC 51645070310

## 2012-07-18 MED FILL — MAPAP (ACETAMINOPHEN) 325 MG TABLET: 325 mg | ORAL | Qty: 2

## 2012-07-18 NOTE — ED Notes (Signed)
I have reviewed prescription/discharge instructions with the patient.  The patient verbalized understanding.

## 2012-07-18 NOTE — ED Provider Notes (Signed)
Patient is a 21 y.o. female presenting with frequency. The history is provided by the patient.   Urinary Frequency   This is a new problem. The current episode started more than 1 week ago. The problem occurs every urination. The problem has not changed since onset.The quality of the pain is described as burning. The pain is mild. There has been no fever. Associated symptoms include frequency. Pertinent negatives include no chills, no sweats, no nausea, no vomiting, no discharge, no hematuria, no hesitancy, no urgency, no flank pain, no vaginal discharge and no back pain. It is unknown if the patient is pregnant.   told she had uti didn't fill or take antibiotics states continues to have dysuria with increased frequency and suprapubic pain no fever chills no vaginal complaint    History reviewed. No pertinent past medical history.     Past Surgical History   Procedure Date   ??? Hx tonsillectomy          History reviewed. No pertinent family history.     History     Social History   ??? Marital Status: SINGLE     Spouse Name: N/A     Number of Children: N/A   ??? Years of Education: N/A     Occupational History   ??? Not on file.     Social History Main Topics   ??? Smoking status: Current Everyday Smoker -- 0.5 packs/day   ??? Smokeless tobacco: Never Used   ??? Alcohol Use: Yes      occ   ??? Drug Use: No   ??? Sexually Active: Yes -- Female partner(s)     Birth Control/ Protection: None     Other Topics Concern   ??? Not on file     Social History Narrative   ??? No narrative on file                  ALLERGIES: Review of patient's allergies indicates no known allergies.      Review of Systems   Constitutional: Negative for fever and chills.   HENT: Negative for neck pain and neck stiffness.    Respiratory: Negative for cough, choking, chest tightness and shortness of breath.    Cardiovascular: Negative for chest pain and palpitations.   Gastrointestinal: Negative for nausea and vomiting.   Genitourinary: Positive for dysuria and  frequency. Negative for hesitancy, urgency, hematuria, flank pain and vaginal discharge.   Musculoskeletal: Negative for back pain.   All other systems reviewed and are negative.        Filed Vitals:    07/18/12 1112   BP: 122/64   Pulse: 65   Temp: 97.7 ??F (36.5 ??C)   Resp: 16   Height: 5\' 9"  (1.753 m)   Weight: 90.719 kg (200 lb)            Physical Exam   Nursing note and vitals reviewed.  Constitutional: She is oriented to person, place, and time. She appears well-developed and well-nourished. No distress.   HENT:   Head: Normocephalic and atraumatic.   Eyes: EOM are normal. Pupils are equal, round, and reactive to light.   Neck: Normal range of motion. Neck supple.   Cardiovascular: Normal rate, regular rhythm, normal heart sounds and intact distal pulses.    No murmur heard.  Pulmonary/Chest: Effort normal and breath sounds normal. No respiratory distress. She has no wheezes. She has no rales. She exhibits no tenderness.   Abdominal: Soft. Bowel sounds are normal.  She exhibits no distension. There is no tenderness. There is no rebound and no guarding.        No cva ttp   Musculoskeletal: Normal range of motion. She exhibits no edema and no tenderness.   Neurological: She is alert and oriented to person, place, and time. No cranial nerve deficit. Coordination normal.   Skin: Skin is warm and dry.        MDM  \\  HISTORY: Pelvic pain. Positive beta hCG level of 1442.   EXAM: Ultrasound pelvis   TECHNIQUE: Real-time grayscale and color Doppler imaging is performed in the   longitudinal and transverse planes. Both the transabdominal and transvaginal   approaches are utilized.   FINDINGS:   Transabdominal: The uterus measures 8.2 cm. There is a small fluid collection   within the endometrium which could represent an early gestational sac. There is   also a question of an early yolk sac. No fetal pole or fetal heart tones   demonstrated. No definite pelvic mass seen.   Transvaginal: The right ovary measures 3.0 x 1.8  x 2.3 cm. The left ovary   measures 3.0 x 1.8 x 2.8 cm. There is a hypoechoic collection adjacent to the   gestational sac most likely are present in a subchorionic hemorrhage. This   measures 1.0 x 0.4 x 1.6 cm. There is a cyst within the left ovary measuring   1.4 x 1.2 x 1.4 cm.   IMPRESSION:   1. Early gestational sac and possible yolk sac, subchorionic hemorrhage, but no   fetal pole or fetal heart tones seen. Serial followup beta hCG levels and   followup ultrasound recommended. Ectopic pregnancy is not excluded at this   point, but no sonographic features of ectopic pregnancy are seen.   2. Left ovarian cyst.    Labs Reviewed   TOTAL HCG, QT. - Abnormal; Notable for the following:     HCG, Qt. 1442 (*)      All other components within normal limits   HCG URINE, QL. - POC - Abnormal; Notable for the following:     Pregnancy test,urine (POC) POSITIVE (*)      All other components within normal limits   URINE MICROSCOPIC - Abnormal; Notable for the following:     WBC >100 (*)      Bacteria 1+ (*)      Amorphous Crystals 4+ (*)      All other components within normal limits   POC URINE DIPSTICK   TYPE, ABO & RH   POC URINE PREGNANCY TEST     reeval resting comfortably without complaint,, d/w patient needs to follow up with ob for recheck and further evaluation    Procedures

## 2012-07-26 LAB — BLOOD TYPE, (ABO+RH)
ABO/Rh(D): O POS
ABO/Rh: O POS

## 2012-07-26 LAB — BETA HCG, QT
Beta HCG, QT: 10013 m[IU]/mL — ABNORMAL HIGH (ref 0.0–6.0)
hCG Quant: 10013 m[IU]/mL — ABNORMAL HIGH (ref 0.0–6.0)

## 2012-07-26 LAB — WET PREP: Wet prep: 30

## 2012-07-26 LAB — METABOLIC PANEL, COMPREHENSIVE
A-G Ratio: 1.2 (ref 1.2–3.5)
ALT (SGPT): 19 U/L (ref 12–65)
AST (SGOT): 16 U/L (ref 15–37)
Albumin: 3.9 g/dL (ref 3.5–5.0)
Alk. phosphatase: 75 U/L (ref 50–136)
Anion gap: 6 mmol/L — ABNORMAL LOW (ref 7–16)
BUN: 9 MG/DL (ref 6–23)
Bilirubin, total: 0.7 MG/DL (ref 0.2–1.1)
CO2: 29 MMOL/L (ref 21–32)
Calcium: 9 MG/DL (ref 8.3–10.4)
Chloride: 105 MMOL/L (ref 98–107)
Creatinine: 0.9 MG/DL (ref 0.6–1.0)
GFR est AA: >60 mL/min/{1.73_m2} (ref >60)
GFR est non-AA: >60 mL/min/{1.73_m2} (ref >60)
Globulin: 3.3 g/dL (ref 2.3–3.5)
Glucose: 81 MG/DL (ref 65–100)
Potassium: 3.9 MMOL/L (ref 3.5–5.1)
Protein, total: 7.2 g/dL (ref 6.3–8.2)
Sodium: 140 MMOL/L (ref 136–145)

## 2012-07-26 LAB — CBC WITH AUTOMATED DIFF
ABS. BASOPHILS: 0 10*3/uL (ref 0.0–0.2)
ABS. EOSINOPHILS: 0.1 10*3/uL (ref 0.0–0.8)
ABS. IMM. GRANS.: 0 10*3/uL (ref 0.0–0.5)
ABS. LYMPHOCYTES: 0.9 10*3/uL (ref 0.5–4.6)
ABS. MONOCYTES: 0.6 10*3/uL (ref 0.1–1.3)
ABS. NEUTROPHILS: 11.4 10*3/uL — ABNORMAL HIGH (ref 1.7–8.2)
BASOPHILS: 0 % (ref 0.0–2.0)
EOSINOPHILS: 0 % — ABNORMAL LOW (ref 0.5–7.8)
HCT: 40.8 % (ref 35.8–46.3)
HGB: 13.9 g/dL (ref 11.7–15.4)
IMMATURE GRANULOCYTES: 0.2 % (ref 0.0–5.0)
LYMPHOCYTES: 7 % — ABNORMAL LOW (ref 13–44)
MCH: 28.8 PG (ref 26.1–32.9)
MCHC: 34.1 g/dL (ref 31.4–35.0)
MCV: 84.5 FL (ref 79.6–97.8)
MONOCYTES: 4 % (ref 4.0–12.0)
MPV: 10.3 FL — ABNORMAL LOW (ref 10.8–14.1)
NEUTROPHILS: 89 % — ABNORMAL HIGH (ref 43–78)
PLATELET: 174 10*3/uL (ref 150–450)
RBC: 4.83 M/uL (ref 4.05–5.25)
RDW: 13.6 % (ref 11.9–14.6)
WBC: 12.9 10*3/uL — ABNORMAL HIGH (ref 4.3–11.1)

## 2012-07-26 LAB — URINE MICROSCOPIC: WBC: >100 /HPF — ABNORMAL HIGH

## 2012-07-26 MED ADMIN — azithromycin (ZITHROMAX) tablet 1,000 mg: ORAL | @ 16:00:00 | NDC 68084027811

## 2012-07-26 MED ADMIN — ondansetron (ZOFRAN) injection 4 mg: INTRAVENOUS | @ 13:00:00 | NDC 00409475503

## 2012-07-26 MED ADMIN — cefTRIAXone (ROCEPHIN) 250 mg IVPB (MBP): INTRAVENOUS | @ 16:00:00 | NDC 00409733701

## 2012-07-26 MED ADMIN — sodium chloride 0.9 % bolus infusion 1,000 mL: INTRAVENOUS | @ 13:00:00 | NDC 00409798309

## 2012-07-26 MED FILL — AZITHROMYCIN 250 MG TAB: 250 mg | ORAL | Qty: 4

## 2012-07-26 MED FILL — SODIUM CHLORIDE 0.9 % IV PIGGY BACK: INTRAVENOUS | Qty: 50

## 2012-07-26 MED FILL — ONDANSETRON (PF) 4 MG/2 ML INJECTION: 4 mg/2 mL | INTRAMUSCULAR | Qty: 2

## 2012-07-26 NOTE — ED Notes (Signed)
I have reviewed discharge instructions with the patient.  The patient verbalized understanding.

## 2012-07-26 NOTE — ED Notes (Signed)
Pt awaiting ultrasound, NAD, fluids infusing and 2 more blankets provided

## 2012-07-26 NOTE — ED Provider Notes (Signed)
Patient is a 21 y.o. female presenting with abdominal pain. The history is provided by the patient.   Abdominal Pain   This is a new problem. The current episode started 3 to 5 hours ago. The problem occurs constantly. The pain is associated with vomiting. The pain is located in the suprapubic region. The quality of the pain is sharp. The pain is severe. Associated symptoms include nausea and vomiting. Pertinent negatives include no fever, no belching, no diarrhea, no flatus, no hematochezia, no melena, no constipation, no dysuria, no frequency, no hematuria, no headaches, no arthralgias, no chest pain and no back pain. Nothing worsens the pain. The pain is relieved by nothing. Past workup comments: Pt. seen 8 days ago dx with pregnancy and uti.        History reviewed. No pertinent past medical history.     Past Surgical History   Procedure Date   ??? Hx tonsillectomy          History reviewed. No pertinent family history.     History     Social History   ??? Marital Status: SINGLE     Spouse Name: N/A     Number of Children: N/A   ??? Years of Education: N/A     Occupational History   ??? Not on file.     Social History Main Topics   ??? Smoking status: Former Smoker -- 0.5 packs/day     Quit date: 07/10/2012   ??? Smokeless tobacco: Never Used   ??? Alcohol Use: Yes      occ   ??? Drug Use: No   ??? Sexually Active: Yes -- Female partner(s)     Birth Control/ Protection: None     Other Topics Concern   ??? Not on file     Social History Narrative   ??? No narrative on file                  ALLERGIES: Review of patient's allergies indicates no known allergies.      Review of Systems   Constitutional: Negative for fever and chills.   HENT: Negative for neck pain.    Respiratory: Negative for cough, choking, shortness of breath and wheezing.    Cardiovascular: Negative for chest pain and palpitations.   Gastrointestinal: Positive for nausea, vomiting and abdominal pain. Negative for diarrhea, constipation, melena, hematochezia and flatus.    Genitourinary: Positive for pelvic pain. Negative for dysuria, urgency, frequency, hematuria, vaginal bleeding and vaginal discharge.   Musculoskeletal: Negative for back pain and arthralgias.   Skin: Negative.    Neurological: Negative for weakness and headaches.   Hematological: Negative.    All other systems reviewed and are negative.        Filed Vitals:    07/26/12 0829   BP: 164/76   Pulse: 85   Temp: 99.1 ??F (37.3 ??C)   Resp: 21   Height: 5\' 9"  (1.753 m)   Weight: 90.719 kg (200 lb)   SpO2: 100%            Physical Exam   Nursing note and vitals reviewed.  Constitutional: She is oriented to person, place, and time. She appears well-developed and well-nourished. No distress.   HENT:   Head: Normocephalic.   Neck: Normal range of motion. Neck supple.   Cardiovascular: Normal rate, regular rhythm, normal heart sounds and intact distal pulses.  Exam reveals no gallop and no friction rub.    No murmur heard.  Pulmonary/Chest: Effort  normal and breath sounds normal. No respiratory distress. She has no wheezes. She has no rales. She exhibits no tenderness.   Abdominal: Soft. Bowel sounds are normal. She exhibits no distension. There is tenderness in the suprapubic area. There is no rebound and no guarding.   Genitourinary: There is no rash, tenderness, lesion or injury on the right labia. There is no rash, tenderness, lesion or injury on the left labia. Uterus is tender. Uterus is not deviated, not enlarged and not fixed. Cervix exhibits discharge. Cervix exhibits no motion tenderness and no friability. Right adnexum displays no mass, no tenderness and no fullness. Left adnexum displays no mass, no tenderness and no fullness. No erythema, tenderness or bleeding around the vagina. No foreign body around the vagina. Vaginal discharge found.   Musculoskeletal: Normal range of motion. She exhibits no edema and no tenderness.   Neurological: She is alert and oriented to person, place, and time.   Skin: Skin is warm and  dry. No rash noted. No erythema. No pallor.        MDM    Procedures    Results Include:    Recent Results (from the past 24 hour(s))   CBC WITH AUTOMATED DIFF    Collection Time    07/26/12  8:48 AM       Component Value Range    WBC 12.9 (*) 4.3 - 11.1 K/uL    RBC 4.83  4.05 - 5.25 M/uL    HGB 13.9  11.7 - 15.4 g/dL    HCT 16.1  09.6 - 04.5 %    MCV 84.5  79.6 - 97.8 FL    MCH 28.8  26.1 - 32.9 PG    MCHC 34.1  31.4 - 35.0 g/dL    RDW 40.9  81.1 - 91.4 %    PLATELET 174  150 - 450 K/uL    MPV 10.3 (*) 10.8 - 14.1 FL    DF AUTOMATED      NEUTROPHILS 89 (*) 43 - 78 %    LYMPHOCYTES 7 (*) 13 - 44 %    MONOCYTES 4  4.0 - 12.0 %    EOSINOPHILS 0 (*) 0.5 - 7.8 %    BASOPHILS 0  0.0 - 2.0 %    IMMATURE GRANULOCYTES 0.2  0.0 - 5.0 %    ABS. NEUTROPHILS 11.4 (*) 1.7 - 8.2 K/UL    ABS. LYMPHOCYTES 0.9  0.5 - 4.6 K/UL    ABS. MONOCYTES 0.6  0.1 - 1.3 K/UL    ABS. EOSINOPHILS 0.1  0.0 - 0.8 K/UL    ABS. BASOPHILS 0.0  0.0 - 0.2 K/UL    ABS. IMM. GRANS. 0.0  0.0 - 0.5 K/UL   METABOLIC PANEL, COMPREHENSIVE    Collection Time    07/26/12  8:48 AM       Component Value Range    Sodium 140  136 - 145 MMOL/L    Potassium 3.9  3.5 - 5.1 MMOL/L    Chloride 105  98 - 107 MMOL/L    CO2 29  21 - 32 MMOL/L    Anion gap 6 (*) 7 - 16 mmol/L    Glucose 81  65 - 100 MG/DL    BUN 9  6 - 23 MG/DL    Creatinine 7.82  0.6 - 1.0 MG/DL    GFR est AA >95  >62 ml/min/1.44m2    GFR est non-AA >60  >60 ml/min/1.70m2    Calcium 9.0  8.3 -  10.4 MG/DL    Bilirubin, total 0.7  0.2 - 1.1 MG/DL    ALT 19  12 - 65 U/L    AST 16  15 - 37 U/L    Alk. phosphatase 75  50 - 136 U/L    Protein, total 7.2  6.3 - 8.2 g/dL    Albumin 3.9  3.5 - 5.0 g/dL    Globulin 3.3  2.3 - 3.5 g/dL    A-G Ratio 1.2  1.2 - 3.5     WET PREP    Collection Time    07/26/12  8:48 AM       Component Value Range    Specimen Description: VAGINAL SPECIMEN      Special Requests: NO SPECIAL REQUESTS      Wet prep        Value: 30 TO 35 PER HPF FEW CLUE CELLS PRESENT NO YEAST SEEN NO TRICHOMONAS  SEEN    Report Status 07/26/2012 FINAL     TYPE, ABO & RH    Collection Time    07/26/12  8:48 AM       Component Value Range    ABO/Rh(D) O POS     TOTAL HCG, QT.    Collection Time    07/26/12  8:48 AM       Component Value Range    HCG, Qt. 100[address] (*) 0.0 - 6.0 MIU/ML      I have discussed the results of labs, procedures, radiographs, treatments as well as any previous results found within the [facility]. [employer] with the patient and available family.?? A treatment plan was developed in conjunction with the patient and was agreed upon. The patient is ready for discharge at this time.?? All voiced understanding of the discharge plan and medication instructions or changes as appropriate.?? Questions about treatment in the ED were answered.?? The patient was encouraged to return should symptoms worsen or new problems develop. A follow up physician was provided to the patient on the discharge papers.

## 2012-07-26 NOTE — ED Notes (Signed)
Onset of abdominal pain this morning around 0630, vomiting no diarrhea

## 2012-07-26 NOTE — ED Notes (Signed)
Pt still in radiology

## 2012-07-27 LAB — CHLAMYDIA / GC-AMPLIFIED
Chlamydia trachomatis, NAA: POSITIVE — AB
Neisseria gonorrhoeae, NAA: NEGATIVE

## 2012-07-28 LAB — CULTURE, URINE
Culture result:: >100000
Culture: >100000

## 2012-08-02 LAB — METABOLIC PANEL, COMPREHENSIVE
A-G Ratio: 1 — ABNORMAL LOW (ref 1.2–3.5)
ALT (SGPT): 20 U/L (ref 12–65)
AST (SGOT): 16 U/L (ref 15–37)
Albumin: 3.8 g/dL (ref 3.5–5.0)
Alk. phosphatase: 74 U/L (ref 50–136)
Anion gap: 12 mmol/L (ref 7–16)
BUN: 10 MG/DL (ref 6–23)
Bilirubin, total: 0.5 MG/DL (ref 0.2–1.1)
CO2: 25 MMOL/L (ref 21–32)
Calcium: 9.2 MG/DL (ref 8.3–10.4)
Chloride: 106 MMOL/L (ref 98–107)
Creatinine: 0.9 MG/DL (ref 0.6–1.0)
GFR est AA: >60 mL/min/{1.73_m2} (ref >60)
GFR est non-AA: >60 mL/min/{1.73_m2} (ref >60)
Globulin: 3.7 g/dL — ABNORMAL HIGH (ref 2.3–3.5)
Glucose: 73 MG/DL (ref 65–100)
Potassium: 3.9 MMOL/L (ref 3.5–5.1)
Protein, total: 7.5 g/dL (ref 6.3–8.2)
Sodium: 143 MMOL/L (ref 136–145)

## 2012-08-02 LAB — URINALYSIS W/ RFLX MICROSCOPIC
Blood: NEGATIVE
Glucose: NEGATIVE MG/DL
Ketone: >=160 MG/DL
Leukocyte Esterase: NEGATIVE
Nitrites: NEGATIVE
Protein: 30 MG/DL — AB
Specific gravity: 1.033 — ABNORMAL HIGH (ref 1.001–1.023)
Urobilinogen: 1 EU/DL (ref 0.2–1.0)
pH (UA): 6 (ref 5.0–9.0)

## 2012-08-02 LAB — CBC W/O DIFF
HCT: 41 % (ref 35.8–46.3)
HGB: 14 g/dL (ref 11.7–15.4)
MCH: 28.7 PG (ref 26.1–32.9)
MCHC: 34.1 g/dL (ref 31.4–35.0)
MCV: 84 FL (ref 79.6–97.8)
MPV: 10.5 FL — ABNORMAL LOW (ref 10.8–14.1)
PLATELET: 183 10*3/uL (ref 150–450)
RBC: 4.88 M/uL (ref 4.05–5.25)
RDW: 13.4 % (ref 11.9–14.6)
WBC: 8.8 10*3/uL (ref 4.3–11.1)

## 2012-08-02 MED ADMIN — benzocaine-menthol (CEPACOL) lozenge: ORAL | @ 23:00:00 | NDC 70000055301

## 2012-08-02 MED ADMIN — dextrose 5% lactated ringers infusion: INTRAVENOUS | @ 22:00:00 | NDC 00409792909

## 2012-08-02 MED ADMIN — dextrose 5% lactated ringers infusion: INTRAVENOUS | @ 16:00:00 | NDC 00409792909

## 2012-08-02 MED ADMIN — ondansetron (ZOFRAN) injection 4 mg: INTRAVENOUS | @ 16:00:00 | NDC 00409475503

## 2012-08-02 MED ADMIN — ondansetron (ZOFRAN) injection 4 mg: INTRAVENOUS | @ 22:00:00 | NDC 00409475503

## 2012-08-02 MED FILL — ONDANSETRON (PF) 4 MG/2 ML INJECTION: 4 mg/2 mL | INTRAMUSCULAR | Qty: 2

## 2012-08-02 MED FILL — CEPACOL SORE THROAT (BENZOCAINE-MENTHOL) 15 MG-2.6 MG LOZENGES: Qty: 16

## 2012-08-02 NOTE — Progress Notes (Signed)
Orders placed.

## 2012-08-02 NOTE — Progress Notes (Signed)
Dr Samuella Cota updated on all labs and UA results. No new orders at this time.

## 2012-08-02 NOTE — ED Notes (Signed)
Spoke with Dr. Samuella Cota who states patient was to be a direct admit.

## 2012-08-02 NOTE — Progress Notes (Signed)
Pt complains of nausea and sore throat. Zofran 4mg  given SIVP per MD order. Pt requesting IV pain medication. Will call MD

## 2012-08-02 NOTE — Progress Notes (Signed)
Report received. Assumed care of patient

## 2012-08-02 NOTE — Progress Notes (Signed)
In room to see pt.   Pt resting comfortably in bed.  .  Pt denies contractions, LOF, VB, H/A, visual disturbances or RUQ pain.  Pt states vomited x1. Small amount of clear emesis in basin. Agrees to call for above or any changes.  Encourage pt to maintain hydration as tolerated.  POC discussed, pt agreeable.  Will monitor.

## 2012-08-02 NOTE — Progress Notes (Signed)
Dr Pandya notified, orders received.

## 2012-08-02 NOTE — Progress Notes (Signed)
Report given to D Hall RN.

## 2012-08-02 NOTE — Progress Notes (Signed)
zofran 4mg given SIVP per MD order for nausea.

## 2012-08-02 NOTE — Progress Notes (Signed)
Pt admitted to room 436 per Dr Samuella Cota. Orders received.

## 2012-08-02 NOTE — Progress Notes (Signed)
Quick Note:    Called and notified pt of positive results, pt treated in the ER with zithromax and rocephin and flagyl.  ______

## 2012-08-02 NOTE — Progress Notes (Signed)
Pt denies complaints of nausea. Pt able to keep down clear liquid tray with crackers from the cafeteria. Pt denies needs or questions at this time.

## 2012-08-02 NOTE — ED Notes (Signed)
Patient presents with excessive vomiting, States she is [redacted] weeks pregnant and Dr. Samuella Cota said he would come see her.

## 2012-08-02 NOTE — Progress Notes (Signed)
Trace ketones in urine

## 2012-08-03 LAB — POC URINE DIPSTICK MANUAL
Glucose, urine (POC): NEGATIVE
Protein (POC): NEGATIVE

## 2012-08-03 MED ADMIN — ondansetron (ZOFRAN ODT) tablet 8 mg: ORAL | @ 22:00:00 | NDC 68462015840

## 2012-08-03 MED ADMIN — dextrose 5% lactated ringers infusion: INTRAVENOUS | @ 13:00:00 | NDC 00409792909

## 2012-08-03 MED ADMIN — ondansetron (ZOFRAN) injection 4 mg: INTRAVENOUS | @ 13:00:00 | NDC 00409475503

## 2012-08-03 MED FILL — ONDANSETRON 8 MG TAB, RAPID DISSOLVE: 8 mg | ORAL | Qty: 1

## 2012-08-03 MED FILL — ONDANSETRON (PF) 4 MG/2 ML INJECTION: 4 mg/2 mL | INTRAMUSCULAR | Qty: 2

## 2012-08-03 NOTE — Progress Notes (Signed)
Pt denies needs or complaints at this time. Pt denies complaint of nausea, pt requesting to eat.

## 2012-08-03 NOTE — Progress Notes (Signed)
Pt sleeping with eyes closed, respirations equal and unlabored.

## 2012-08-03 NOTE — H&P (Signed)
History and Physical     Ante Partum High Risk Pregnancy Note    Patient is 21 y.o African American female, G3 P1 - admitted for IV fluids and antiemetics - secondary to Hyperemesis. She is currently 6-[redacted] weeks pregnant. She was seen in office day before yesterday, with similar complaints - was started on PO antiemetics. Her symptoms did not improve - came to hospital had 2+ ketones and was admitted to hospital for IV fluids and hydration.    Patient admitted for hyperemesis gravidarum, nausea and vomiting and unable to tolerate PO - solids or liquids. states she does not  have  headache , abdominal pain  , contractions, right upper quadrant pain  , vaginal bleeding , swelling and vaginal leaking of fluid .    LOS:  1 day    PMH: No DM, No TB, No HTN, No Asthma  PSH: Hx of tonsillectomy  Habbits: No ETOH - since pregnant, No smoking, No IVDA  STD: +ve Hx of CFhlamydia    Vitals: Temp (24hrs), Avg:0 ??F (-17.8 ??C), Min:, Max:   @IPVITALS (<SYNTAX> error)@    I&O:        HEENT: neg  Heart: RRR  Lungs: CTA                  Exam:  Patient without distress.               Abdomen: soft, non-tender, No Guarding or rigidity  Pelvic: deferred                   Lab/Data Review:  All lab results for the last 24 hours reviewed.    Assessment and Plan:      Active Problems:   * No active hospital problems. *         Hyperemesis Gravidarum:  Antiemetic Therapy including Phenergan/Zofran/Reglan/Zantac  Aggressive intravenous hydration  Check urine for ketones until trace results  Strict Input and Output and Daily weights  Patient is cleared of ketones and has improved symptomatically.    Discharge patient home with follow up in office - on PO Phenergan.

## 2012-08-03 NOTE — Progress Notes (Signed)
IV capped, pt up to the shower. Pt denies complaints of nausea. Pt asking for crackers, stating that food on her lunch tray is "gross". Dr Samuella Cota updated on pt status and will be up on the floor to round.

## 2012-08-03 NOTE — Progress Notes (Signed)
Pt states has vomited one more time this shift. Offered zofran but pt refuses at this time.

## 2012-08-03 NOTE — Progress Notes (Signed)
Dr Samuella Cota called, report given regarding infiltrated IV, order received to leave IV out.

## 2012-08-03 NOTE — Progress Notes (Signed)
Report given to Liz Gladson RN. Pt care relinquished.

## 2012-08-03 NOTE — Progress Notes (Signed)
Urine dipped, negative ketones

## 2012-08-03 NOTE — Progress Notes (Signed)
Report received, care assumed

## 2012-08-03 NOTE — Progress Notes (Signed)
Afternoon assessment completed as charted.  Pt denies complaints, pt instructed to call with needs and concerns, pt verbalized understanding, questions encouraged.  IV removed due to infiltration.

## 2012-08-03 NOTE — Progress Notes (Signed)
zofran 4mg given SIVP per MD order.

## 2012-08-03 NOTE — Progress Notes (Signed)
Pt denies complaints of nausea at this time. Pt states she doesn't have much of an appetite. Instructed pt to maintain PO hydration as tolerated, and to call out with next void so it can be dipped for ketones. Pt verbalized understanding and denies needs or questions at this time.

## 2012-08-03 NOTE — Progress Notes (Signed)
Problem: Nutrition Deficit  Goal: *Optimize nutritional status  Nutrition Assessment:  Nursing nutrition admission assessment triggered BPA for hyperemesis.  Pt is a 21 year old [place] American female who presents with hyperemesis.  Pt states she is [redacted] weeks pregnant with nausea / emesis past several weeks. EMR History / Physical not yet available.  Labs reviewed:  BMP unremarkable. Pertinent Rx:  Receiving IVF which provides 612 non protein calories, Zofran.  Pt received a clear liquid diet this am; drank apple juice and then had n/v past drinking juice. Pt does report that frequency of emesis is decreasing.  Diet advanced to hyperemesis; has not yet tried solid food.  Anthropometrics:  Height:  5'9"  Weight:  80.7 kg stated  Pt reports ~15# unintentional weight loss since pregnancy; at 88% usual body weight.  Macronutrient Needs Assessment:  EER: 2325-2725 kcal/day (25-30 kcal/kg/actual weight + additional 300 kcal / pregnancy)  EPR:  ~78 gm pro/day (0.8 gm/kg/IBW + additional 25 gm / pregnancy)  CHO:  Deferred in this non diabetic patient  H2O:  1 ml/kcal or per MD recommendation  Diagnosis:  Inadequate calorie and protein intake r/t hyperemesis as evidenced by unintentional weight loss past 6 weeks; currently at 88% usual body weight).  Intervention:  Goal:  Adequate po intake to promote adequate maternal weight gain and prevent dehydration and electrolyte imbalances.  Plan:  Initiate Hyperemesis diet; briefly discussed the dietary guidelines for hyperemesis diet and encouraged patient to eat small, frequent meals alternating dry and fluid feedings.  Provide snack basket to patient.  Monitoring / Evaluation:  Follow up on adequacy of po intake, nutrition related labs, weight status.  Donna Jensen, RD, LD, MPH  [phone]

## 2012-08-03 NOTE — Progress Notes (Signed)
Pt discharge instructions given, pt verbalized understanding, questions encouraged.

## 2012-08-28 NOTE — ED Notes (Signed)
States [redacted] weeks pregnant with possible miscarriage onset tonight with large amount of vaginal bleeding when using the restroom.

## 2012-08-28 NOTE — ED Provider Notes (Signed)
HPI Comments: Pt. [redacted] weeks pregnant with prior US done by OB 2 weeks ago.  No complications except N/V.  Today developed vaginal bleeding like period without abd pain.  No tissue.  G2P1    Patient is a 21 y.o. female presenting with vaginal bleeding. The history is provided by the patient. No language interpreter was used.   Vaginal Bleeding  This is a new problem. The current episode started 1 to 2 hours ago. The problem occurs constantly. The problem has not changed since onset.Pertinent negatives include no chest pain, no abdominal pain, no headaches and no shortness of breath. Nothing aggravates the symptoms. Nothing relieves the symptoms. She has tried nothing for the symptoms.        Past Medical History   Diagnosis Date   ??? Chlamydia         Past Surgical History   Procedure Laterality Date   ??? Hx tonsillectomy     ??? Hx other surgical           History reviewed. No pertinent family history.     History     Social History   ??? Marital Status: SINGLE     Spouse Name: N/A     Number of Children: N/A   ??? Years of Education: N/A     Occupational History   ??? Not on file.     Social History Main Topics   ??? Smoking status: Former Smoker -- 0.50 packs/day     Quit date: 07/10/2012   ??? Smokeless tobacco: Never Used   ??? Alcohol Use: No   ??? Drug Use: No   ??? Sexually Active: Yes -- Female partner(s)     Birth Control/ Protection: None     Other Topics Concern   ??? Not on file     Social History Narrative   ??? No narrative on file                  ALLERGIES: Review of patient's allergies indicates no known allergies.      Review of Systems   Constitutional: Negative for fever and chills.   HENT: Negative for neck pain and neck stiffness.    Eyes: Negative for pain and redness.   Respiratory: Negative for chest tightness, shortness of breath and wheezing.    Cardiovascular: Negative for chest pain and leg swelling.   Gastrointestinal: Negative for nausea, vomiting, abdominal pain and diarrhea.   Genitourinary: Positive for  vaginal bleeding. Negative for dysuria, hematuria and vaginal discharge.   Musculoskeletal: Negative for back pain and gait problem.   Skin: Negative for color change and rash.   Neurological: Negative for weakness and headaches.       Filed Vitals:    08/28/12 2223   BP: 111/65   Pulse: 80   Temp: 98.5 ??F (36.9 ??C)   Resp: 20   Height: 5\' 9"  (1.753 m)   Weight: 77.111 kg (170 lb)   SpO2: 99%            Physical Exam   Constitutional: She is oriented to person, place, and time. She appears well-developed and well-nourished. No distress.   HENT:   Head: Normocephalic and atraumatic.   Neck: Normal range of motion. Neck supple.   Cardiovascular: Normal rate and regular rhythm.    No murmur heard.  Pulmonary/Chest: Effort normal and breath sounds normal. She has no wheezes.   Abdominal: Soft. Bowel sounds are normal. There is no tenderness.   Genitourinary: There  is bleeding around the vagina. No tenderness around the vagina. No vaginal discharge found.   Os closed   Musculoskeletal: Normal range of motion. She exhibits no edema.   Neurological: She is alert and oriented to person, place, and time.   Skin: Skin is warm and dry.        MDM     Amount and/or Complexity of Data Reviewed:   Clinical lab tests:  Ordered and reviewed  Tests in the radiology section of CPT??:  Ordered and reviewed  Tests in the medicine section of the CPT??:  Ordered and reviewed  Risk of Significant Complications, Morbidity, and/or Mortality:   Presenting problems:  Moderate  Diagnostic procedures:  Moderate  Management options:  Moderate  Progress:   Patient progress:  Stable      Procedures     [place] PELV NON OB W TV (Final result)  Result time: 07/26/12 13:06:14      Final result by Desiree Hane, MD (07/26/12 13:06:14)      Impression:    IMPRESSION:  1. Newly confirmed single intrauterine gestation with positive fetal heart  motion at approximately 6 weeks today.    2. Persistent small subchorionic bleed.    3. Stable 1.5 cm left ovarian  cyst.    4. Newly visualized hypoechoic right adnexal tubal structure is somewhat  unusual, and the possibility of superimposed PID should be considered in light  of the clinical presentation.      Narrative:    TRANSABDOMINAL AND TRANSVAGINAL PELVIC SONOGRAPHY, July 26, 2012    CLINICAL HISTORY: 21 year old gravida 2, para 1 with last menstrual period  06/16/2012 presents with pelvic pain. Quantitative hCG is 10,013, increased  from 1442 on 07/18/2012.    FINDINGS: Transabdominal and transvaginal images are compared with 07/18/2012  ultrasound. There was incomplete filling of the bladder despite intravenous  saline and the patient drinking water for one hour. Now confirmed is an  intrauterine gestational sac with yolk sac and positive fetal heart motion.  Crown-rump length and mean sac diameter corresponds to an approximate menstrual  age of 5 weeks, 5 days +/- 3 days, which agrees well with the menstrual  history. Small subchorionic hemorrhage is again noted. Both ovaries remain  normal in size, and a 1.5 cm complex left ovarian cyst appears unchanged. No  abnormal free fluid is seen in the pelvis, but now noted is a serpiginous  structure in the right adnexa with maximal diameter of approximately 9 mm which  may represent tubal dilatation. No associated hyperemia is seen.             Results Include:    Recent Results (from the past 24 hour(s))   URINE MICROSCOPIC    Collection Time    08/28/12 10:35 PM       Result Value Range    WBC >100 (*) 0 /HPF    RBC 10-20  0 /HPF    Epithelial cells 0  0 /HPF    Bacteria 1+ (*) 0 /HPF    Casts 50-100 HYALINE  0 /LPF   HCG URINE, QL. - POC    Collection Time    08/28/12 10:38 PM       Result Value Range    Pregnancy test,urine (POC) POSITIVE (*) NEGATIVE   CBC WITH AUTOMATED DIFF    Collection Time    08/28/12 11:00 PM       Result Value Range    WBC 12.0 (*) 4.3 - 11.1 K/uL  RBC 4.17  4.05 - 5.25 M/uL    HGB 11.9  11.7 - 15.4 g/dL    HCT 16.1 (*) 09.6 - 46.3 %     MCV 83.2  79.6 - 97.8 FL    MCH 28.5  26.1 - 32.9 PG    MCHC 34.3  31.4 - 35.0 g/dL    RDW 04.5  40.9 - 81.1 %    PLATELET 190  150 - 450 K/uL    MPV 9.9 (*) 10.8 - 14.1 FL    DF AUTOMATED      NEUTROPHILS 70  43 - 78 %    LYMPHOCYTES 22  13 - 44 %    MONOCYTES 7  4.0 - 12.0 %    EOSINOPHILS 1  0.5 - 7.8 %    BASOPHILS 0  0.0 - 2.0 %    IMMATURE GRANULOCYTES 0.3  0.0 - 5.0 %    ABS. NEUTROPHILS 8.5 (*) 1.7 - 8.2 K/UL    ABS. LYMPHOCYTES 2.6  0.5 - 4.6 K/UL    ABS. MONOCYTES 0.8  0.1 - 1.3 K/UL    ABS. EOSINOPHILS 0.1  0.0 - 0.8 K/UL    ABS. BASOPHILS 0.0  0.0 - 0.2 K/UL    ABS. IMM. GRANS. 0.0  0.0 - 0.5 K/UL   METABOLIC PANEL, COMPREHENSIVE    Collection Time    08/28/12 11:00 PM       Result Value Range    Sodium 140  136 - 145 MMOL/L    Potassium 3.3 (*) 3.5 - 5.1 MMOL/L    Chloride 104  98 - 107 MMOL/L    CO2 25  21 - 32 MMOL/L    Anion gap 11  7 - 16 mmol/L    Glucose 84  65 - 100 MG/DL    BUN 6  6 - 23 MG/DL    Creatinine 9.14  0.6 - 1.0 MG/DL    GFR est AA >78  >29 ml/min/1.70m2    GFR est non-AA >60  >60 ml/min/1.20m2    Calcium 8.7  8.3 - 10.4 MG/DL    Bilirubin, total PENDING      ALT PENDING      AST PENDING      Alk. phosphatase PENDING      Protein, total PENDING      Albumin PENDING      Globulin PENDING      A-G Ratio PENDING     TYPE, ABO & RH    Collection Time    08/28/12 11:00 PM       Result Value Range    ABO/Rh(D) O POS         TOTAL HCG, QT. (Final result) Abnormal Component (Lab Inquiry)       Result Time HCGN     08/28/12 23:50:17 [id number] (H)  DILUTED Gestational Age 22.2-1 Week 5-50 mIU/mL Gestational Age 22-2 Weeks 50-500 mIU/mL Gestational Age 1-3 Weeks [phone] mIU/mL Gestational Age 587-4 Weeks 500-10000 mIU/mL Gestational Age 586-5 Weeks 1000-50000 mIU/mL Gestational Age 581-6 Weeks 10000-100000 mIU/ml Gestational Age 66-8 Weeks 15000-200000 mIU/ml Gestational Age 1-3 Months 10000-100000 mIU/ml If this is for miscarriage diagnosis it is recommended that repeat testing be done at this facility.                  [place] UTS TRANSVAGINAL OB (Final result)  Result time: 08/29/12 01:57:07      Final result by Clenton Pare, MD (08/29/12 01:57:07)      Impression:  IMPRESSION: 11 week 1 day IUP with fetal heart rate of 154 bpm. A small  subchronic hemorrhage evolving.            Narrative:    PELVIC ULTRASOUND.    History: Pregnancy and vaginal bleeding. HCG 107,000.    Technique: Transabdominal sector images through a urine distended bladder and  high resolution endovaginal images of the pelvis.    Findings: The uterus is normal size and measures 12.5 cm x 6.5 cm x 6.0 cm.  Intrauterine gestational sac is present. Fetal pole is documented with fetal  heart rate of 154 beats per minute. Small subchronic hemorrhage is  redemonstrated.    Right ovary: 1.8 x 1.2 x 1.5 cm.    Left ovary: 2.6 x 1.7 x 1.6 cm.

## 2012-08-28 NOTE — ED Notes (Signed)
Checked on patient, gave her a blanket.

## 2012-08-29 LAB — BLOOD TYPE, (ABO+RH)
ABO/Rh(D): O POS
ABO/Rh: O POS

## 2012-08-29 LAB — BETA HCG, QT
Beta HCG, QT: 107606 m[IU]/mL — ABNORMAL HIGH (ref 0.0–6.0)
hCG Quant: 107606 m[IU]/mL — ABNORMAL HIGH (ref 0.0–6.0)

## 2012-08-29 LAB — CBC WITH AUTOMATED DIFF
ABS. BASOPHILS: 0 10*3/uL (ref 0.0–0.2)
ABS. EOSINOPHILS: 0.1 10*3/uL (ref 0.0–0.8)
ABS. IMM. GRANS.: 0 10*3/uL (ref 0.0–0.5)
ABS. LYMPHOCYTES: 2.6 10*3/uL (ref 0.5–4.6)
ABS. MONOCYTES: 0.8 10*3/uL (ref 0.1–1.3)
ABS. NEUTROPHILS: 8.5 10*3/uL — ABNORMAL HIGH (ref 1.7–8.2)
BASOPHILS: 0 % (ref 0.0–2.0)
EOSINOPHILS: 1 % (ref 0.5–7.8)
HCT: 34.7 % — ABNORMAL LOW (ref 35.8–46.3)
HGB: 11.9 g/dL (ref 11.7–15.4)
IMMATURE GRANULOCYTES: 0.3 % (ref 0.0–5.0)
LYMPHOCYTES: 22 % (ref 13–44)
MCH: 28.5 PG (ref 26.1–32.9)
MCHC: 34.3 g/dL (ref 31.4–35.0)
MCV: 83.2 FL (ref 79.6–97.8)
MONOCYTES: 7 % (ref 4.0–12.0)
MPV: 9.9 FL — ABNORMAL LOW (ref 10.8–14.1)
NEUTROPHILS: 70 % (ref 43–78)
PLATELET: 190 10*3/uL (ref 150–450)
RBC: 4.17 M/uL (ref 4.05–5.25)
RDW: 13.9 % (ref 11.9–14.6)
WBC: 12 10*3/uL — ABNORMAL HIGH (ref 4.3–11.1)

## 2012-08-29 LAB — METABOLIC PANEL, COMPREHENSIVE
A-G Ratio: 0.8 — ABNORMAL LOW (ref 1.2–3.5)
ALT (SGPT): 16 U/L (ref 12–65)
AST (SGOT): 16 U/L (ref 15–37)
Albumin: 2.8 g/dL — ABNORMAL LOW (ref 3.5–5.0)
Alk. phosphatase: 58 U/L (ref 50–136)
Anion gap: 11 mmol/L (ref 7–16)
BUN: 6 MG/DL (ref 6–23)
Bilirubin, total: 0.3 MG/DL (ref 0.2–1.1)
CO2: 25 MMOL/L (ref 21–32)
Calcium: 8.7 MG/DL (ref 8.3–10.4)
Chloride: 104 MMOL/L (ref 98–107)
Creatinine: 0.7 MG/DL (ref 0.6–1.0)
GFR est AA: >60 mL/min/{1.73_m2} (ref >60)
GFR est non-AA: >60 mL/min/{1.73_m2} (ref >60)
Globulin: 3.6 g/dL — ABNORMAL HIGH (ref 2.3–3.5)
Glucose: 84 MG/DL (ref 65–100)
Potassium: 3.3 MMOL/L — ABNORMAL LOW (ref 3.5–5.1)
Protein, total: 6.4 g/dL (ref 6.3–8.2)
Sodium: 140 MMOL/L (ref 136–145)

## 2012-08-29 LAB — URINE MICROSCOPIC
Epithelial cells: 0 /HPF
WBC: >100 /HPF — ABNORMAL HIGH

## 2012-08-29 LAB — HCG URINE, QL. - POC: Pregnancy test,urine (POC): POSITIVE — AB

## 2012-08-29 NOTE — ED Notes (Signed)
Pt is sleeping in bed with son.

## 2012-08-29 NOTE — ED Notes (Signed)
I have reviewed discharge instructions and prescriptions with the patient.  The patient verbalized understanding.

## 2012-11-08 NOTE — L&D Delivery Note (Signed)
Delivery Note    Obstetrician:  Kaleen Odea, MD    Assistant: Surgical Tech    Pre-Delivery Diagnosis: Term pregnancy, Spontaneous labor, Single fetus or Pregnancy complicated by: Hx of Drug Abuse during pregnancy.    Post-Delivery Diagnosis: Living newborn infant(s) or Female    Intrapartum Event: None    Procedure: Spontaneous vaginal delivery    Epidural: YES    Monitor:  Fetal Heart Tones - External and Uterine Contractions - External    Indications for instrumental delivery: none    Estimated Blood Loss: 500    Episiotomy: none    Laceration(s):  periurethral    Laceration(s) repair: YES    Presentation: Cephalic    Fetal Description: singleton    Fetal Position: Left Occiput Anterior    Birth Weight: 3650 grams.    Birth Length: 51.5 cms.    Apgar - One Minute: 9    Apgar - Five Minutes: 9    Umbilical Cord: 3 vessels present and Cord blood sent to lab for type, Rh, and Coombs' test    Specimens: none           Complications:  none           Cord Blood Results:   Information for the patient's newborn:  Yenny, Kosa Girl [[id number]]     Lab Results   Component Value Date/Time    ABO/Rh(D) O POS 03/20/2013 11:17 PM    DAT IgG NEG 03/20/2013 11:17 PM     Prenatal Labs:     Lab Results   Component Value Date/Time    ABO/Rh(D) [place] POS 03/20/2013  4:20 PM        Attending Attestation: I was present and scrubbed for the entire procedure    Signed By:  Kaleen Odea, MD     Mar 22, 2013

## 2012-12-19 LAB — WET PREP: Wet prep: 1

## 2012-12-19 LAB — URINE MICROSCOPIC

## 2012-12-19 MED ADMIN — cefTRIAXone (ROCEPHIN) 250 mg in lidocaine (XYLOCAINE) 10 mg/mL (1 %) IM syringe: INTRAMUSCULAR | @ 21:00:00 | NDC 00409733701

## 2012-12-19 MED ADMIN — azithromycin (ZITHROMAX) powder 1 g: ORAL | @ 21:00:00 | NDC 59762305101

## 2012-12-19 MED ADMIN — metroNIDAZOLE (FLAGYL) tablet 2,000 mg: ORAL | @ 21:00:00 | NDC 51079021701

## 2012-12-19 MED FILL — METRONIDAZOLE 500 MG TAB: 500 mg | ORAL | Qty: 4

## 2012-12-19 MED FILL — CEFTRIAXONE 250 MG SOLUTION FOR INJECTION: 250 mg | INTRAMUSCULAR | Qty: 250

## 2012-12-19 MED FILL — AZITHROMYCIN 1 GRAM ORAL PACKET: 1 gram | ORAL | Qty: 1

## 2012-12-19 NOTE — ED Provider Notes (Addendum)
HPI Comments: Pt. Who is currently 6 months pregnant with EDC of 03/20/2013 presents stating that she has vaginal discharge x several days and was exposed to a female partner who was diagnosed with trichomonas. She denies abdominal/pelvic pain or vaginal bleeding. She states she is here for treatment.    The history is provided by the patient.        Past Medical History   Diagnosis Date   ??? Chlamydia         Past Surgical History   Procedure Laterality Date   ??? Hx tonsillectomy     ??? Hx other surgical           History reviewed. No pertinent family history.     History     Social History   ??? Marital Status: SINGLE     Spouse Name: N/A     Number of Children: N/A   ??? Years of Education: N/A     Occupational History   ??? Not on file.     Social History Main Topics   ??? Smoking status: Former Smoker -- 0.50 packs/day     Quit date: 07/10/2012   ??? Smokeless tobacco: Never Used   ??? Alcohol Use: No   ??? Drug Use: No   ??? Sexually Active: Yes -- Female partner(s)     Birth Control/ Protection: None     Other Topics Concern   ??? Not on file     Social History Narrative   ??? No narrative on file                  ALLERGIES: Review of patient's allergies indicates no known allergies.      Review of Systems   Constitutional: Negative for fever, chills, diaphoresis and fatigue.   Gastrointestinal: Negative for nausea, vomiting, abdominal pain, diarrhea, constipation, abdominal distention and rectal pain.   Genitourinary: Positive for vaginal discharge. Negative for dysuria, urgency, frequency, hematuria, decreased urine volume, vaginal bleeding, difficulty urinating, vaginal pain and pelvic pain.   Musculoskeletal: Negative.    All other systems reviewed and are negative.        Filed Vitals:    12/19/12 1454   BP: 113/50   Pulse: 99   Temp: 98.2 ??F (36.8 ??C)   Resp: 18   Height: 5\' 7"  (1.702 m)   Weight: 90.719 kg (200 lb)   SpO2: 100%            Physical Exam   Nursing note and vitals reviewed.  Constitutional: She is oriented to  person, place, and time. Vital signs are normal. She appears well-developed and well-nourished. She is active.  Non-toxic appearance. She does not appear ill. No distress.   Cardiovascular: Normal rate, regular rhythm, normal heart sounds and intact distal pulses.  Exam reveals no gallop and no friction rub.    No murmur heard.  Pulmonary/Chest: Effort normal and breath sounds normal. No respiratory distress. She has no wheezes.   Abdominal: Soft. Normal appearance and bowel sounds are normal. She exhibits no distension, no abdominal bruit, no pulsatile midline mass and no mass. There is no hepatosplenomegaly. There is no tenderness. There is no rigidity, no rebound, no guarding and no CVA tenderness.   Genitourinary: Uterus is enlarged. Uterus is not tender. Cervix exhibits discharge. Cervix exhibits no motion tenderness and no friability. Right adnexum displays no mass, no tenderness and no fullness. Left adnexum displays no mass, no tenderness and no fullness. No erythema, tenderness or bleeding around  the vagina. Vaginal discharge found.   Moderate amount of white discharge in vagina and around cervical os which is closed.   Neurological: She is alert and oriented to person, place, and time. No cranial nerve deficit.   Skin: Skin is warm and dry.   Psychiatric: She has a normal mood and affect. Her speech is normal and behavior is normal.        MDM     Amount and/or Complexity of Data Reviewed:   Clinical lab tests:  Ordered and reviewed   Discuss the patient with another provider:  No  Risk of Significant Complications, Morbidity, and/or Mortality:   Presenting problems:  Moderate  Diagnostic procedures:  Low  Management options:  Low  Progress:   Patient progress:  Improved and stable      Procedures    Recent Results (from the past 84 hour(s))   URINE MICROSCOPIC    Collection Time     12/19/12  3:10 PM       Result Value Range    WBC 10-20  0 /HPF    RBC 0-3  0 /HPF    Epithelial cells 10-20  0 /HPF     Bacteria TRACE  0 /HPF    Casts 3-5 HYALINE  0 /LPF   WET PREP    Collection Time     12/19/12  4:30 PM       Result Value Range    Specimen Description: CERVICAL      Special Requests: NO SPECIAL REQUESTS      Wet prep        Value: 1 TO 5 WBCS/HPF      RARE MOTILE TRICHOMONAS NOTED      NO YEAST SEEN CLUE CELLS ABSENT    Report Status 12/19/2012 FINAL         I discussed the results of all labs, procedures, radiographs, and treatments with the patient and available family. Treatment plan is agreed upon and the patient is ready for discharge. All voiced understanding of the discharge plan and medication instructions or changes as appropriate. Questions about treatment in the ED were answered. All were encouraged to return should symptoms worsen or new problems develop.

## 2012-12-19 NOTE — ED Notes (Signed)
Dc with dc instructions; reviewed with pt; sts understanding.

## 2012-12-19 NOTE — ED Notes (Signed)
Patient reports increase in discharge and itching for two days

## 2012-12-19 NOTE — ED Provider Notes (Signed)
Patient is a 22 y.o. female presenting with vaginal discharge.   Vaginal Discharge          Past Medical History   Diagnosis Date   ??? Chlamydia         Past Surgical History   Procedure Laterality Date   ??? Hx tonsillectomy     ??? Hx other surgical           History reviewed. No pertinent family history.     History     Social History   ??? Marital Status: SINGLE     Spouse Name: N/A     Number of Children: N/A   ??? Years of Education: N/A     Occupational History   ??? Not on file.     Social History Main Topics   ??? Smoking status: Former Smoker -- 0.50 packs/day     Quit date: 07/10/2012   ??? Smokeless tobacco: Never Used   ??? Alcohol Use: No   ??? Drug Use: No   ??? Sexually Active: Yes -- Female partner(s)     Birth Control/ Protection: None     Other Topics Concern   ??? Not on file     Social History Narrative   ??? No narrative on file                  ALLERGIES: Review of patient's allergies indicates no known allergies.      Review of Systems   Genitourinary: Positive for vaginal discharge.       Filed Vitals:    12/19/12 1454   BP: 113/50   Pulse: 99   Temp: 98.2 ??F (36.8 ??C)   Resp: 18   Height: 5\' 7"  (1.702 m)   Weight: 90.719 kg (200 lb)   SpO2: 100%            Physical Exam     MDM    Procedures

## 2012-12-27 LAB — CHLAMYDIA / GC-AMPLIFIED
Chlamydia trachomatis, NAA: POSITIVE — AB
Neisseria gonorrhoeae, NAA: NEGATIVE

## 2012-12-28 NOTE — ED Notes (Signed)
Reviewed the chart. Pt was treated for chlamydia. She was contacted and told of the positive test and that she had been treated correctly.

## 2012-12-29 NOTE — Progress Notes (Signed)
Quick Note:    Pt treated with rocephin, zithromax and flagyl.  ______

## 2013-01-09 LAB — POC URINE DIPSTICK MANUAL
Glucose, urine (POC): NEGATIVE
Ketones (POC): >160
Protein (POC): 30

## 2013-01-09 LAB — URINALYSIS W/ RFLX MICROSCOPIC
Blood: NEGATIVE
Glucose: NEGATIVE mg/dL
Ketone: >80 mg/dL — AB
Nitrites: NEGATIVE
Specific gravity: 1.028 — ABNORMAL HIGH (ref 1.001–1.023)
Urobilinogen: 1 EU/dL (ref 0.2–1.0)
pH (UA): 6 (ref 5.0–9.0)

## 2013-01-09 LAB — CBC WITH AUTOMATED DIFF
ABS. BASOPHILS: 0 10*3/uL (ref 0.0–0.2)
ABS. EOSINOPHILS: 0 10*3/uL (ref 0.0–0.8)
ABS. IMM. GRANS.: 0 10*3/uL (ref 0.0–0.5)
ABS. LYMPHOCYTES: 1.6 10*3/uL (ref 0.5–4.6)
ABS. MONOCYTES: 0.6 10*3/uL (ref 0.1–1.3)
ABS. NEUTROPHILS: 8.7 10*3/uL — ABNORMAL HIGH (ref 1.7–8.2)
BASOPHILS: 0 % (ref 0.0–2.0)
EOSINOPHILS: 0 % — ABNORMAL LOW (ref 0.5–7.8)
HCT: 34.6 % — ABNORMAL LOW (ref 35.8–46.3)
HGB: 11.5 g/dL — ABNORMAL LOW (ref 11.7–15.4)
IMMATURE GRANULOCYTES: 0.3 % (ref 0.0–5.0)
LYMPHOCYTES: 14 % (ref 13–44)
MCH: 29.4 PG (ref 26.1–32.9)
MCHC: 33.2 g/dL (ref 31.4–35.0)
MCV: 88.5 FL (ref 79.6–97.8)
MONOCYTES: 6 % (ref 4.0–12.0)
MPV: 10.8 FL (ref 10.8–14.1)
NEUTROPHILS: 80 % — ABNORMAL HIGH (ref 43–78)
PLATELET: 188 10*3/uL (ref 150–450)
RBC: 3.91 M/uL — ABNORMAL LOW (ref 4.05–5.25)
RDW: 13.6 % (ref 11.9–14.6)
WBC: 11 10*3/uL (ref 4.3–11.1)

## 2013-01-09 LAB — DRUG SCREEN, URINE
AMPHETAMINES: NEGATIVE
BARBITURATES: NEGATIVE
BENZODIAZEPINES: NEGATIVE
COCAINE: NEGATIVE
METHADONE: NEGATIVE
OPIATES: NEGATIVE
PCP(PHENCYCLIDINE): NEGATIVE
THC (TH-CANNABINOL): POSITIVE

## 2013-01-09 MED ADMIN — nalBUPHine (NUBAIN) injection 10 mg: INTRAMUSCULAR | @ 16:00:00 | NDC 00409146301

## 2013-01-09 MED FILL — NALBUPHINE 10 MG/ML INJECTION: 10 mg/mL | INTRAMUSCULAR | Qty: 1

## 2013-01-09 NOTE — Progress Notes (Signed)
Dr Samuella Cota updated, orders received.

## 2013-01-09 NOTE — Progress Notes (Signed)
UA and UDS sent to the lab.

## 2013-01-09 NOTE — Progress Notes (Signed)
Results given to Dr Samuella Cota, ordered to send down to the ED for further evaluation.

## 2013-01-09 NOTE — Progress Notes (Signed)
Pt states pain is "tolerable", pain 5/10 in upper mid abdomen. Discharge instructions given. Pt verbalized understanding and denies needs or questions at this time. Pt given nubain 10mg  IM left DG.

## 2013-01-09 NOTE — Progress Notes (Signed)
Dr Samuella Cota called, left a message on VM

## 2013-01-09 NOTE — Progress Notes (Signed)
Pt to triage from the ED, with comlalints of upper abdominal pain that started at 2030 last night.

## 2013-01-09 NOTE — Progress Notes (Signed)
Pt signed informed consent, refusing a MSE from the ED.

## 2013-01-09 NOTE — Progress Notes (Signed)
Pt called Dr Hendricks Milo office requesting an antibiotic. Pt informed that there was a need for a MSE from the ED. Pt states, "if MD won't give me a prescription I'll go to the ED, otherwise I'll sign the informed consent." Waiting on MD to call back.

## 2013-01-11 ENCOUNTER — Inpatient Hospital Stay: Payer: MEDICAID

## 2013-01-11 NOTE — Progress Notes (Signed)
Dr Samuella Cota updated on pts assessment, brethine, uds, and nubain ordered

## 2013-01-11 NOTE — Progress Notes (Signed)
Discharge instructions reviewed with pt and family, written copy provided as reinforcement. Pt and family verbalized understanding. Pt assisted into clothing

## 2013-01-11 NOTE — Progress Notes (Signed)
Pt arrived ambulatory to triage complaining of extreme pressure to the vagina and lower back times 1 hour, pt reports regular fetal movement, denies bleeding, lof epigastric pain, headache, visual disturbace, nausea, vomiting, numbness or tingling to the extremeties. Pt escorted to 439, provided with gown and placed on monitor fht's 150

## 2013-01-11 NOTE — Progress Notes (Signed)
Pt taken to exit doors via wheelchair, pt accompanied by family. Pt denies needs or concerns at this time

## 2013-01-11 NOTE — Progress Notes (Signed)
Dr Samuella Cota updated on positive drug screen and contraction pattern brethine to be repeated. MD ordered pt to be advised of positive drug screen

## 2013-01-12 LAB — DRUG SCREEN, URINE
AMPHETAMINES: NEGATIVE
BARBITURATES: NEGATIVE
BENZODIAZEPINES: NEGATIVE
COCAINE: NEGATIVE
METHADONE: NEGATIVE
OPIATES: NEGATIVE
PCP(PHENCYCLIDINE): NEGATIVE
THC (TH-CANNABINOL): POSITIVE

## 2013-01-12 MED ADMIN — terbutaline (BRETHINE) injection 0.25 mg: SUBCUTANEOUS | @ 02:00:00 | NDC 63323066501

## 2013-01-12 MED ADMIN — terbutaline (BRETHINE) injection 0.25 mg: SUBCUTANEOUS | @ 01:00:00 | NDC 63323066501

## 2013-01-12 MED ADMIN — nalBUPHine (NUBAIN) injection 10 mg: SUBCUTANEOUS | @ 02:00:00 | NDC 00409146301

## 2013-02-07 LAB — POC URINE DIPSTICK MANUAL
Glucose, urine (POC): NEGATIVE
Ketones (POC): NEGATIVE

## 2013-02-07 MED ADMIN — nalBUPHine (NUBAIN) injection 10 mg: INTRAMUSCULAR | @ 05:00:00 | NDC 00409146301

## 2013-02-07 NOTE — Progress Notes (Signed)
Pt called RN to bedside. This RN at b/s. Pt states her pain is now gone and "I am ready to go home". Plan of care discussed with pt including orders received. Instructed would notify MD of patient status in approximately 15 minutes.   Pt in agreement with plan of care. All questions answered. Support remains at b/s.

## 2013-02-07 NOTE — Progress Notes (Signed)
Pt now states she is feeling pressure, sve performed. Findings discussed with pt.

## 2013-02-07 NOTE — Progress Notes (Signed)
Efm removed by Holley Bouche, RN. Plan of care and orders received discussed with pt and family at b/s. Pt instructed she would have to follow up in ER for medical screening exam. Purpose and rationale explained to pt. Pt verbalizes understanding and agreement with plan of care.

## 2013-02-07 NOTE — Progress Notes (Addendum)
SBAR report called to charge nurse in ER by Holley Bouche ,RN. Aware patient to return to ER for medical screening exam.   Pt to be placed in room 12 in ER

## 2013-02-07 NOTE — Progress Notes (Signed)
Telephone report to Dr. Samuella Cota of pt arrival, c/o, hx, cardio/toco pattern, sve, vital signs, urine dip, pt reported pain scale and status, pt reports of constant lower abdominal pain that does not feel like ctx's to her with denial of bleeding or lof. Notified of pt hx of positive UDS on last triage visit. Orders received for Nubain 10 mg IM x 1 dose and continue to monitor.

## 2013-02-07 NOTE — Progress Notes (Signed)
At b/s, plan of care and orders received discussed with pt. All questions answered. Support at side.   Pt medicated per md orders. Side effects and purpose discussed with pt.   Side rails up x 2, lights dimmed. Call light within reach.   Pt repositioned to left tilt with pillows. Pt encouraged to maintain position for monitoring. Instructed on maternal movement noted on efm strip and rationale for maintaining position for monitoring. Pt verbalizes understanding.

## 2013-02-07 NOTE — Progress Notes (Addendum)
At b/s, introductions made. Plan of care discussed. efm applied. Vital signs assessed.   Pt states onset of abdominal pain at 11 pm, states pain is constant and in lower abdomen. States feels like a cramp, but states "it feels different than contractions." Reports positive fetal movement. Denies bleeding or leaking of fluid.   Patient states pain started this pm after stressful evening at home and being upset.

## 2013-02-07 NOTE — ED Notes (Signed)
Pt and mom verbalized understanding of discharge instructions  Pt  minimally cooperative, borderline beligerant.  Pt declined wheelchair, ambulatory from ED, Mom holding onto pt's arm

## 2013-02-07 NOTE — Progress Notes (Signed)
Cardio adjusted

## 2013-02-07 NOTE — Progress Notes (Signed)
Pt discharged to home per md orders with weeks 34 to 36 of pregnancy: after visit instructions and general nursing discharge summary instructions. Pt instructed to keep next ob appointment, to drink atleast 8-10 glasses water a day, and rest. Pt instructed to continue daily fetal kick counts. Pt instructed she may take tylenol as directed for pain or discomfort and to notify md for contractions that increase in intensity or frequency, more than 6 contractions in one hour,  for bleeding, for leaking of fluid, or decreased fetal movement. Pt verbalizes understanding of all instructions. All questions answered. Support at side. See discharge instructions.   Pt left unit via wheelchair (to ER) in stable condition with family at side. No active labor noted.   Assisted to ER via wheelchair with support at side by Holley Bouche, RN. Placed in ER room 12 (report called as documented).

## 2013-02-07 NOTE — ED Provider Notes (Signed)
Patient is a 22 y.o. female presenting with abdominal pain. The history is provided by the patient and a parent.   Abdominal Pain   This is a new problem. The current episode started 3 to 5 hours ago. The problem occurs constantly. The problem has been resolved. The pain is located in the suprapubic region. The quality of the pain is pressure-like. The pain is at a severity of 0/10. The patient is experiencing no pain. Pertinent negatives include no diarrhea, no nausea, no vomiting and no constipation. Nothing worsens the pain. The patient's surgical history non-contributory.       Past Medical History   Diagnosis Date   ??? Chlamydia      treated in Feb 2014        Past Surgical History   Procedure Laterality Date   ??? Hx tonsillectomy     ??? Hx other surgical           Family History   Problem Relation Age of Onset   ??? Hypertension Maternal Grandmother         History     Social History   ??? Marital Status: SINGLE     Spouse Name: N/A     Number of Children: N/A   ??? Years of Education: N/A     Occupational History   ??? Not on file.     Social History Main Topics   ??? Smoking status: Former Smoker -- 0.50 packs/day     Quit date: 07/10/2012   ??? Smokeless tobacco: Never Used   ??? Alcohol Use: No   ??? Drug Use: No   ??? Sexually Active: Yes -- Female partner(s)     Birth Control/ Protection: None     Other Topics Concern   ??? Not on file     Social History Narrative   ??? No narrative on file                  ALLERGIES: Review of patient's allergies indicates no known allergies.      Review of Systems   Constitutional: Negative.  Negative for activity change.   HENT: Negative.    Eyes: Negative.    Respiratory: Negative.    Cardiovascular: Negative.    Gastrointestinal: Positive for abdominal pain. Negative for nausea, vomiting, diarrhea and constipation.   Genitourinary: Negative.    Musculoskeletal: Negative.    Skin: Negative.    Neurological: Negative.    Psychiatric/Behavioral: Negative.    All other systems reviewed and are  negative.        Filed Vitals:    02/07/13 0018 02/07/13 0020 02/07/13 0119 02/07/13 0139   BP:  119/68 111/53 110/53   Pulse:  114 88 84   Temp:  98.2 ??F (36.8 ??C)     Resp:  18 18 18    Height: 5\' 9"  (1.753 m)      Weight: 88.905 kg (196 lb)               Physical Exam   Nursing note and vitals reviewed.  Constitutional: She is oriented to person, place, and time. She appears well-developed and well-nourished.   HENT:   Head: Normocephalic and atraumatic.   Right Ear: External ear normal.   Left Ear: External ear normal.   Eyes: Conjunctivae and EOM are normal. Pupils are equal, round, and reactive to light.   Neck: Normal range of motion. Neck supple.   Cardiovascular: Normal rate, regular rhythm and intact distal pulses.  Pulmonary/Chest: Effort normal and breath sounds normal.   Abdominal: Soft. Bowel sounds are normal. There is no tenderness. There is no rigidity, no rebound, no guarding, no tenderness at McBurney's point and negative Murphy's sign.       Musculoskeletal: Normal range of motion.   Neurological: She is alert and oriented to person, place, and time. No cranial nerve deficit.   Skin: Skin is warm and dry.   Psychiatric: She has a normal mood and affect.        MDM     Differential Diagnosis; Clinical Impression; Plan:     etal assessment and labor evaluation done in L and D all normal. Patient sent to ED pain free and both mother and patient state she is fine and want to be discharged.  Risk of Significant Complications, Morbidity, and/or Mortality:   Presenting problems:  Low  Diagnostic procedures:  Low  Management options:  Low  Progress:   Patient progress:  Stable      Procedures

## 2013-02-07 NOTE — Progress Notes (Addendum)
Telephone report to dr. Samuella Cota of cardio, toco pattern noted, vital signs, patient reported pain scale and status, and patient reports that she is now feeling better and ready to go home. Orders received to discharge patient from ob triage and pt to follow up in office as scheduled.

## 2013-02-07 NOTE — Progress Notes (Signed)
Patient presents to triage with c/o of "stomach hurting real bad". Placed in room 437. Pt instructed to change into gown and obtain urine sample

## 2013-02-07 NOTE — Progress Notes (Signed)
Abdomen palpated; toco adjusted

## 2013-03-04 ENCOUNTER — Inpatient Hospital Stay: Payer: MEDICAID

## 2013-03-04 NOTE — Progress Notes (Signed)
Patient discharged home per MD order. Discharge instructions completed and patient verbalized understanding. Questions encouraged. Accompanied by significant other. Stable at discharge.

## 2013-03-04 NOTE — Progress Notes (Signed)
Dr. Samuella Cota on phone. Report given. Orders to discharge home with follow up in office.

## 2013-03-04 NOTE — Progress Notes (Signed)
Pt to room 437 for triage with chief complaint of contractions. Assessment begins, EFM and Toco applied and tracing well.

## 2013-03-20 ENCOUNTER — Inpatient Hospital Stay: Admit: 2013-03-20 | Discharge: 2013-03-22 | Disposition: A | Discharge status: Home / Self Care | Payer: MEDICAID

## 2013-03-20 LAB — TYPE AND SCREEN
ABO/Rh: O POS
Antibody Screen: NEGATIVE

## 2013-03-20 LAB — CBC W/O DIFF
HCT: 36.7 % (ref 35.8–46.3)
HGB: 12.4 g/dL (ref 11.7–15.4)
MCH: 29.9 PG (ref 26.1–32.9)
MCHC: 33.8 g/dL (ref 31.4–35.0)
MCV: 88.4 FL (ref 79.6–97.8)
MPV: 11 FL (ref 10.8–14.1)
PLATELET: 160 10*3/uL (ref 150–450)
RBC: 4.15 M/uL (ref 4.05–5.25)
RDW: 14.1 % (ref 11.9–14.6)
WBC: 14.9 10*3/uL — ABNORMAL HIGH (ref 4.3–11.1)

## 2013-03-20 LAB — TYPE & SCREEN
ABO/Rh(D): O POS
Antibody screen: NEGATIVE

## 2013-03-20 MED ADMIN — lactated ringers infusion: INTRAVENOUS | @ 20:00:00 | NDC 00409795309

## 2013-03-20 MED ADMIN — ropivacaine (PF) (NAROPIN) 5 mg/mL (0.5 %) injection: PERINEURAL | @ 23:00:00 | NDC 63323028630

## 2013-03-20 MED ADMIN — bupivacaine (PF) (MARCAINE) 0.25 % (2.5 mg/mL) injection: EPIDURAL | @ 21:00:00 | NDC 00409155930

## 2013-03-20 MED ADMIN — ropivacaine (NAROPIN) injection: EPIDURAL | @ 21:00:00 | NDC 70069006225

## 2013-03-20 MED ADMIN — lactated ringers infusion: INTRAVENOUS | @ 23:00:00 | NDC 00409795309

## 2013-03-20 MED ADMIN — dextrose 5% lactated ringers infusion: INTRAVENOUS | @ 20:00:00 | NDC 00409792909

## 2013-03-20 MED ADMIN — ePHEDrine (MISTOLE) 50 mg/mL injection: INTRAVENOUS | @ 23:00:00 | NDC 17478051500

## 2013-03-20 MED FILL — LACTATED RINGERS IV: INTRAVENOUS | Qty: 1000

## 2013-03-20 MED FILL — DEXTROSE 5%-LACTATED RINGERS IV: INTRAVENOUS | Qty: 1000

## 2013-03-20 NOTE — Anesthesia Procedure Notes (Signed)
Epidural Block    Start time: 03/20/2013 4:54 PM  End time: 03/20/2013 5:07 PM  Reason for block: labor epidural  Staffing  Anesthesiologist: Ojani Berenson J  Performed by: anesthesiologist   Prep  Risks and benefits discussed with the patient and plans are to proceed   Timeout performed, 16:54  Patient was placed in seated positionThe back was prepped at the lumbar region  Prep Solution(s): Betadine  Epidural  Epidural Location: L4-5  Needle: 17G Tuohy  Attempts: needle passed 1 time(s) with loss of resistance using air  Catheter: 19 G epidural catheter placed/secured  Catheter at skin depth: approximately 11cm  Catheter into epidural space: approximately 4cm  No blood with aspiration, no cerebrospinal fluid with aspiration, no paresthesia and negative aspiration test  Test dose: lidocaine 1.5% w/ epi (1701)  Assessment  Catheter was secured to back with tegaderm and tape  Insertion was uncomplicated and patient tolerated without any apparent complications    Epidural Block    Start time: 03/20/2013 7:22 PM  End time: 03/20/2013 7:30 PM  Reason for block: labor epidural  Staffing  Anesthesiologist: Nawal Burling J  Performed by: anesthesiologist   Prep  Risks and benefits discussed with the patient and plans are to proceed   Timeout performed, 19:22  Patient was placed in seated positionThe back was prepped at the lumbar region  Prep Solution(s): Betadine  Epidural  Epidural Location: L3-4  Needle: 17G Tuohy  Attempts: needle passed 1 time(s) with loss of resistance using air  Catheter: 19 G epidural catheter placed/secured  Catheter at skin depth: approximately 11cm  Catheter into epidural space: approximately 4cm  No blood with aspiration, no cerebrospinal fluid with aspiration, no paresthesia and negative aspiration test  Test dose: lidocaine 1.5% w/ epi and negative (1924)  Assessment  Catheter was secured to back with tegaderm and tape  Insertion was uncomplicated and patient tolerated without any apparent  complications  Additional Notes  Epidural replaced after first catheter noted to be dislodged

## 2013-03-20 NOTE — Progress Notes (Signed)
Report given to J. Beaulah Corin, RN, care of pt relinquished.

## 2013-03-20 NOTE — Progress Notes (Signed)
Dr Samuella Cota called, report given, orders received.

## 2013-03-20 NOTE — Progress Notes (Signed)
Dr. Murphy at bedside.

## 2013-03-20 NOTE — Progress Notes (Signed)
Pt complain of nausea and pain.  Pt pressed epidural button.  BP down 89/43  IV bolus infusing.

## 2013-03-20 NOTE — Anesthesia Procedure Notes (Addendum)
Epidural Block    Start time: 03/20/2013 4:54 PM  End time: 03/20/2013 5:07 PM  Reason for block: labor epidural  Staffing  Anesthesiologist: Vianka Ertel J  Performed by: anesthesiologist   Prep  Risks and benefits discussed with the patient and plans are to proceed   Timeout performed, 16:54  Patient was placed in seated positionThe back was prepped at the lumbar region  Prep Solution(s): Betadine  Epidural  Epidural Location: L4-5  Needle: 17G Tuohy  Attempts: needle passed 1 time(s) with loss of resistance using air  Catheter: 19 G epidural catheter placed/secured  Catheter at skin depth: approximately 11cm  Catheter into epidural space: approximately 4cm  No blood with aspiration, no cerebrospinal fluid with aspiration, no paresthesia and negative aspiration test  Test dose: lidocaine 1.5% w/ epi (1701)  Assessment  Catheter was secured to back with tegaderm and tape  Insertion was uncomplicated and patient tolerated without any apparent complications    Epidural Block    Start time: 03/20/2013 7:22 PM  End time: 03/20/2013 7:30 PM  Reason for block: labor epidural  Staffing  Anesthesiologist: Sydell Prowell J  Performed by: anesthesiologist   Prep  Risks and benefits discussed with the patient and plans are to proceed   Timeout performed, 19:22  Patient was placed in seated positionThe back was prepped at the lumbar region  Prep Solution(s): Betadine  Epidural  Epidural Location: L3-4  Needle: 17G Tuohy  Attempts: needle passed 1 time(s) with loss of resistance using air  Catheter: 19 G epidural catheter placed/secured  Catheter at skin depth: approximately 11cm  Catheter into epidural space: approximately 4cm  No blood with aspiration, no cerebrospinal fluid with aspiration, no paresthesia and negative aspiration test  Test dose: lidocaine 1.5% w/ epi and negative (1924)  Assessment  Catheter was secured to back with tegaderm and tape  Insertion was uncomplicated and patient tolerated without any apparent  complications  Additional Notes  Epidural replaced after first catheter noted to be dislodged

## 2013-03-20 NOTE — Progress Notes (Signed)
Pt presents to floor with complaints of contractions, pt placed in room 437.  Triage assessment completed as charted, TOCO and EFM applied.  SVE 5/50/-2.

## 2013-03-20 NOTE — Anesthesia Pre-Procedure Evaluation (Signed)
Anesthetic History   No history of anesthetic complications           Review of Systems / Medical History      Pulmonary  Within defined limits               Neuro/Psych   Within defined limits           Cardiovascular  Within defined limits              Exercise tolerance: >4 METS     GI/Hepatic/Renal  Within defined limits                Endo/Other  Within defined limits           Other Findings            Physical Exam    Airway  Mallampati: I  TM Distance: 4 - 6 cm  Neck ROM: normal range of motion   Mouth opening: Normal     Cardiovascular    Rhythm: regular           Dental  No notable dental hx       Pulmonary  Breath sounds clear to auscultation               Abdominal  GI exam deferred       Other Findings            Anesthetic Plan    ASA: 2  Anesthesia type: epidural          Induction: Intravenous  Anesthetic plan and risks discussed with: Patient      Discussed anesthesia options and risks with patient who consents to proceed with epidural analgesia

## 2013-03-20 NOTE — Progress Notes (Signed)
Rep[ort received.  IV started and labs sent.  IV bolus infusing for epidural.

## 2013-03-20 NOTE — Progress Notes (Signed)
Bedside SBAR report received from Jodi Coyne, RN; care assumed.

## 2013-03-20 NOTE — Progress Notes (Signed)
Pt in right tilt starting to feel good relief with epidural.  4/10

## 2013-03-20 NOTE — Progress Notes (Addendum)
Delivery Note    Dr Samuella Cota arrived to bedside at 2310.      Pt positioned for delivery and set up at 2310.    Spontaneous vaginal delivery of viable female infant.      Apgar's 9/9.    See delivery summary for details.

## 2013-03-20 NOTE — Progress Notes (Signed)
Spoke with Dr. Samuella Cota; MD on his way

## 2013-03-20 NOTE — Progress Notes (Signed)
IV fluid bolus started; 500 mL given.

## 2013-03-20 NOTE — Progress Notes (Signed)
Pt moved to room 424 for admission for labor.

## 2013-03-20 NOTE — H&P (Signed)
History & Physical    Name: Donna Jensen MRN: [id number]  SSN: [id number]    Date of Birth: 29-Jan-1991  Age: 22 y.o.  Sex: female        Subjective:     Estimated Date of Delivery: 03/20/13  OB History    Grav Para Term Preterm Abortions TAB SAB Ect Mult Living    3 1 1  1  1           # Outc Date GA Lbr Len/2nd Wgt Sex Del Anes PTL Lv    1 TRM 2009    M VAGINAL DELI EPI      2 SAB             3 CUR                   Donna Jensen is admitted with pregnancy at [redacted]w[redacted]d for active labor. Prenatal course was normal. Please see prenatal records for details.    Past Medical History   Diagnosis Date   ??? Chlamydia      treated in Feb 2014     Past Surgical History   Procedure Laterality Date   ??? Hx tonsillectomy     ??? Hx other surgical       Social History     Occupational History   ??? Not on file.     Social History Main Topics   ??? Smoking status: Current Every Day Smoker -- 0.50 packs/day     Last Attempt to Quit: 07/10/2012   ??? Smokeless tobacco: Never Used   ??? Alcohol Use: No   ??? Drug Use: No   ??? Sexually Active: Yes -- Female partner(s)     Birth Control/ Protection: None     Family History   Problem Relation Age of Onset   ??? Hypertension Maternal Grandmother        No Known Allergies  Prior to Admission medications    Medication Sig Start Date End Date Taking? Authorizing Provider   PNV no.24-iron-folic acid-dha (PRENATAL DHA+COMPLETE PRENATAL) [id number] mg-mcg-mg cmpk Take 1 Cap by mouth daily.   Yes Phys Other, MD        Review of Systems: A comprehensive review of systems was negative except for that written in the HPI.    Objective:     Vitals:  Filed Vitals:    03/20/13 1725 03/20/13 1730 03/20/13 1735 03/20/13 1745   BP: 118/53 102/66 116/55 113/57   Pulse: 73 74 80 85   Temp:       Resp:    18   Height:       Weight:            Physical Exam:  Patient without distress.  Breast: deferred  Heart: Regular rate and rhythm  Lung: clear to auscultation throughout lung fields, no wheezes, no rales, no rhonchi and normal  respiratory effort  Back: costovertebral angle tenderness absent  Abdomen: soft, nontender  Fundus: soft and non tender  Perineum: blood absent, amniotic fluid absent  Cervical Exam: 5 cm dilated    70% effaced    -2 station    Presenting Part: cephalic  Cervical Position: mid position  Consistency: Soft  Lower Extremities: no edema  Membranes:  Artificial Rupture of Membranes; Amniotic Fluid: medium amount of thin meconium fluid  Fetal Heart Rate: Reactive  Baseline: 140-150 per minute  Variability: moderate  Accelerations: yes  Uterine contractions: irregular, every 3-5 minutes    Prenatal Labs:  No results found for this basename: OBEXTABORH, OBExtAbscrn, OBExtRubella, OBExtGrBS, OBExtHBsAg, OBExtHIV, OBExtRPR, OBExtGonorr, OBExtChlam         Assessment/Plan:     Plan: Admit for Labor  Progressing normally, Continue plan for vaginal delivery.  Group B Strep was negative.  Patient had epidural for pain control.   Recheck in 2 hours.    Signed By:  Kaleen Odea, MD     Mar 20, 2013

## 2013-03-20 NOTE — Progress Notes (Signed)
Pt repositioned to right hip tilt; fetal monitor adjusted; will continue to monitor.

## 2013-03-20 NOTE — Progress Notes (Addendum)
Report given to K. Lacy,RN.  Epidural cath. migrated out of position.  Dr Eulah Pont to replace.

## 2013-03-20 NOTE — Progress Notes (Signed)
Dr Murphy at bedside.  Pt positioned for epidural.

## 2013-03-20 NOTE — Progress Notes (Signed)
Dr. Eulah Pont and Tonna Corner, CRNA in room; epidural cath replaced; Time out at [facility]; test dose given at 1926; pt laid back with left hip tilt; monitor readjusted; will continue to monitor.

## 2013-03-20 NOTE — Progress Notes (Signed)
DR PANDYA AT BEDSIDE. SVE 5/80/-2, AROM WITH MECONIUM FLUID OF MODERATE AMOUNT. RN TO RECHECK CERVIX IN 2 HOURS AND CALL DR FOR FURTHER ORDERS.

## 2013-03-20 NOTE — Progress Notes (Signed)
BP 87/53  Donna Jensen at bedside.

## 2013-03-21 LAB — RT--CORD BLOOD GAS
BASE EXCESS,CBA: 1.6 mmol/L (ref 0.0–3.0)
HCO3,CORD BLD ARTERIAL: 29 mmol/L — ABNORMAL HIGH (ref 22–26)
PCO2,CORD BLD ARTERIAL: 56 mmHg — ABNORMAL HIGH (ref 33–49)
PH,CORD BLD ARTERIAL: 7.331 — ABNORMAL HIGH (ref 7.21–7.31)
PO2,CORD BLD ARTERIAL: <36 mmHg — ABNORMAL HIGH (ref 9–19)

## 2013-03-21 LAB — HGB & HCT
HCT: 33.4 % — ABNORMAL LOW (ref 35.8–46.3)
HGB: 11.4 g/dL — ABNORMAL LOW (ref 11.7–15.4)

## 2013-03-21 MED ADMIN — ePHEDrine (MISTOLE) 50 mg/mL injection: INTRAVENOUS | @ 03:00:00 | NDC 17478051500

## 2013-03-21 MED ADMIN — oxytocin (PITOCIN) 30 units/500 ml LR: INTRAVENOUS | @ 03:00:00 | NDC 99991083550

## 2013-03-21 MED ADMIN — ibuprofen (MOTRIN) tablet 400 mg: ORAL | @ 06:00:00 | NDC 00904174861

## 2013-03-21 MED ADMIN — HYDROcodone-acetaminophen (NORCO) 7.5-325 mg per tablet 1 Tab: ORAL | @ 06:00:00 | NDC 00406036662

## 2013-03-21 MED ADMIN — HYDROcodone-acetaminophen (NORCO) 7.5-325 mg per tablet 1 Tab: ORAL | @ 11:00:00 | NDC 00406036623

## 2013-03-21 MED ADMIN — ibuprofen (MOTRIN) tablet 400 mg: ORAL | @ 11:00:00 | NDC 68084065811

## 2013-03-21 MED FILL — IBUPROFEN 400 MG TAB: 400 mg | ORAL | Qty: 1

## 2013-03-21 MED FILL — EPHEDRINE SULFATE 50 MG/ML IJ SOLN: 50 mg/mL | INTRAMUSCULAR | Qty: 25

## 2013-03-21 MED FILL — HYDROCODONE-ACETAMINOPHEN 7.5 MG-325 MG TAB: ORAL | Qty: 1

## 2013-03-21 MED FILL — BUPIVACAINE (PF) 0.25 % (2.5 MG/ML) IJ SOLN: 0.25 % (2.5 mg/mL) | INTRAMUSCULAR | Qty: 10

## 2013-03-21 MED FILL — OXYTOCIN 30 UNIT IN 500 ML INFUSION: 30 unit/500 mL | INTRAVENOUS | Qty: 500

## 2013-03-21 MED FILL — NAROPIN (PF) 2 MG/ML (0.2 %) INJECTION SOLUTION: 2 mg/mL (0. %) | INTRAMUSCULAR | Qty: 60.17

## 2013-03-21 MED FILL — NAROPIN (PF) 5 MG/ML (0.5 %) INJECTION SOLUTION: 5 mg/mL (0. %) | INTRAMUSCULAR | Qty: 5

## 2013-03-21 NOTE — Progress Notes (Signed)
SBAR OUT Report: Mother    Verbal report given to Erskine Speed, RN on this patient, who is now being transferred to MIU for routine progression of care.   The patient is not wearing a green "Anesthesia-Duramorph" band.    Report consisted of patient's Situation, Background, Assessment and Recommendations (SBAR).       Newborn ID bands were compared with the identification form, and verified with the patient and receiving nurse.    Information from the Procedure Summary and the Hollister Report was reviewed with the receiving nurse; opportunity for questions and clarification provided.

## 2013-03-21 NOTE — Progress Notes (Signed)
Report received from Heather Cox RN, care assumed.

## 2013-03-21 NOTE — Anesthesia Post-Procedure Evaluation (Signed)
Post-Anesthesia Evaluation and Assessment    Patient: Donna Jensen MRN: [id number]  SSN: [id number]    Date of Birth: 01-26-1991  Age: 22 y.o.  Sex: female       Cardiovascular Function/Vital Signs  Visit Vitals   Item Reading   ??? BP 128/60   ??? Pulse 63   ??? Temp 36.4 ??C (97.5 ??F)   ??? Resp 18   ??? Ht 5\' 9"  (1.753 m)   ??? Wt 92.987 kg (205 lb)   ??? BMI 30.26 kg/m2   ??? Breastfeeding No       Patient is status post * No anesthesia type entered * anesthesia for * No procedures listed *.    Nausea/Vomiting: None    Postoperative hydration reviewed and adequate.    Pain:  Pain Scale 1: Numeric (0 - 10) (03/21/13 0745)  Pain Intensity 1: 0 (03/21/13 0745)   Managed    Neurological Status:   Neuro (WDL): Within Defined Limits (03/21/13 0030)   At baseline    Mental Status and Level of Consciousness: Alert and oriented     Pulmonary Status:   O2 Device: Room air (03/21/13 0315)   Adequate oxygenation and airway patent    Complications related to anesthesia: None    Post-anesthesia assessment completed. No concerns, stable for transfer of care to MIU    Signed By: Deitra Mayo, MD     Mar 21, 2013

## 2013-03-21 NOTE — Progress Notes (Signed)
Post-Partum Day Number 1 Progress Note    Patient doing well post-partum without significant complaint.  Voiding withour difficulty, normal lochia.    Vitals:  Patient Vitals for the past 8 hrs:   BP Temp Pulse Resp   03/22/13 0729 109/67 mmHg 97.4 ??F (36.3 ??C) 80 18     Temp (24hrs), Avg:97.6 ??F (36.4 ??C), Min:97.4 ??F (36.3 ??C), Max:97.8 ??F (36.6 ??C)      Vital signs stable, afebrile.    Exam:  Patient without distress.               Abdomen soft, fundus firm at level of umbilicus, nontender               Perineum with normal lochia noted.               Lower extremities are negative for swelling, cords or tenderness.    Lab/Data Review:  All lab results for the last 24 hours reviewed.    Assessment and Plan:  Patient appears to be having uncomplicated post-partum course.  Continue routine perineal care and maternal education.  Plan discharge tomorrow if no problems occur.

## 2013-03-21 NOTE — Other (Signed)
SBAR IN Report: Mother    Verbal report received from Lavell Anchors RN (full name & credentials) on this patient, who is now being transferred from L&D (unit) for routine progression of care.  The patient is not wearing a green "Anesthesia-Duramorph" band.    Report consisted of patient's Situation, Background, Assessment and Recommendations (SBAR).       Newborn ID bands were compared with the identification form, and verified with the patient and transferring nurse.    Information from the SBAR and the Hollister Report was reviewed with the transferring nurse; opportunity for questions and clarification provided.

## 2013-03-21 NOTE — Progress Notes (Signed)
Report received from Meagan Eby RN, care assumed.

## 2013-03-21 NOTE — Progress Notes (Signed)
Admission assessment complete. Pt given 1 Norco tablet and 400 mg Motrin for pain. RN to reassess.

## 2013-03-21 NOTE — Progress Notes (Addendum)
Assisted pt up to restroom. Pt voiding without difficulty. Taught pt pericare using peribottle. Pt returned demonstrated teaching. Pt ambulated back to bed unassisted. Pt denies needs.

## 2013-03-21 NOTE — Progress Notes (Signed)
Pt given 400 mg Motrin and 1 Norco for pain. RN to reassess.

## 2013-03-21 NOTE — Progress Notes (Signed)
Pt. Denies pain or needs. Family at bedside.

## 2013-03-21 NOTE — Progress Notes (Signed)
Pt given 400 mg Motrin and 1 Norco tablet for pain. RN to reassess.

## 2013-03-22 MED ADMIN — HYDROcodone-acetaminophen (NORCO) 7.5-325 mg per tablet 1 Tab: ORAL | @ 14:00:00 | NDC 00406036623

## 2013-03-22 MED ADMIN — ibuprofen (MOTRIN) tablet 400 mg: ORAL | @ 14:00:00 | NDC 68084065811

## 2013-03-22 MED ADMIN — HYDROcodone-acetaminophen (NORCO) 7.5-325 mg per tablet 1 Tab: ORAL | @ 05:00:00 | NDC 00406036623

## 2013-03-22 MED ADMIN — ibuprofen (MOTRIN) tablet 400 mg: ORAL | NDC 68084065811

## 2013-03-22 MED ADMIN — HYDROcodone-acetaminophen (NORCO) 7.5-325 mg per tablet 1 Tab: ORAL | NDC 00406036623

## 2013-03-22 MED ADMIN — ibuprofen (MOTRIN) tablet 400 mg: ORAL | @ 10:00:00 | NDC 68084065811

## 2013-03-22 MED ADMIN — ibuprofen (MOTRIN) tablet 400 mg: ORAL | @ 05:00:00 | NDC 68084065811

## 2013-03-22 MED ADMIN — HYDROcodone-acetaminophen (NORCO) 7.5-325 mg per tablet 1 Tab: ORAL | @ 10:00:00 | NDC 00406036623

## 2013-03-22 MED FILL — HYDROCODONE-ACETAMINOPHEN 7.5 MG-325 MG TAB: ORAL | Qty: 1

## 2013-03-22 MED FILL — IBUPROFEN 400 MG TAB: 400 mg | ORAL | Qty: 1

## 2013-03-22 NOTE — Progress Notes (Signed)
Pt given 400 mg Motrin and 1 Norco tablet for pain. RN to reassess.

## 2013-03-22 NOTE — Progress Notes (Signed)
Shift assessment complete.

## 2013-03-22 NOTE — Progress Notes (Signed)
Discharge teachng complete.Pt verbalized understanding, questions encouraged..  Pt. Stable and discharged to home per MD order. Pt. Left unit via ambulating with family , newborn in carseat. Newborn escorted off unit by MIU staff. Pt. To home via private automobile.

## 2013-03-22 NOTE — Progress Notes (Signed)
Motrin and norco admin per request for pain.

## 2013-03-22 NOTE — Progress Notes (Signed)
Report received from Meagan Eby RN, care assumed.

## 2013-03-22 NOTE — Progress Notes (Signed)
Post-Partum Day Number 2 Progress/Discharge Note    Patient doing well post-partum without significant complaint.  Voiding without difficulty, normal lochia, positive flatus.    Vitals:  Patient Vitals for the past 8 hrs:   BP Temp Pulse Resp   03/22/13 0729 109/67 mmHg 97.4 ??F (36.3 ??C) 80 18     Temp (24hrs), Avg:97.6 ??F (36.4 ??C), Min:97.4 ??F (36.3 ??C), Max:97.8 ??F (36.6 ??C)      Vital signs stable, afebrile.    Exam:  Patient without distress.               Abdomen soft, fundus firm at level of umbilicus, non tender               Perineum with normal lochia noted.               Lower extremities are negative for swelling, cords or tenderness.    Lab/Data Review:  All lab results for the last 24 hours reviewed.    Assessment and Plan:  Patient appears to be having uncomplicated post-partum course.  Continue routine perineal care and maternal education. Plan discharge for today with follow up in our office in 6 weeks.

## 2013-03-22 NOTE — Progress Notes (Signed)
Pt given 400 mg Motrin and 1 Norco for pain. RN to reassess.

## 2013-03-22 NOTE — Progress Notes (Signed)
Referral per MD.  Pt had positive UDS for THC on 3-14, SW consult was not ordered back in March.  MD did not order UDS upon admission for delivery.  NB UDS was NEGATIVE.  SW spoke with pt, FOB Donna Jensen) was asleep at bedside.  Pt states she lives in her own apt at (  [address]  [address]  [phone] ).  Pt has a son Donna Jensen  06-29-08 ) attends [employer].  Pt denies any current drug use, or any past or present involvement with DSS.  Pt states she is on [facility], receives [employer].  Pt states has all NB supplies including crib, car seat etc.  Pt's mom Donna Jensen ) and g'mother Donna Jensen ) are her support system.  Peds f/u at [facility].  SW made report to DSS/CPS spoke with Maryelizabeth Kaufmann based on positive UDS from March.  DSS aware pt and NB are discharging home today.  If DSS accepts case for investigation they will f/u with mom at home.

## 2013-06-03 NOTE — ED Notes (Signed)
Talked with Dr.Paylor about pt. Will splint and discharge for f/u

## 2013-06-03 NOTE — ED Notes (Signed)
States broke fingers on left hand fighting

## 2013-06-03 NOTE — ED Notes (Signed)
I have reviewed discharge instructions with the patient.  The patient verbalized understanding of instructions, prescription (1) and need to follow up. Pt ambulated out, with mom and babies, in nad
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
## 2013-06-04 MED ADMIN — HYDROcodone-acetaminophen (NORCO) 5-325 mg per tablet 1 Tab: ORAL | @ 03:00:00 | NDC 68084036811

## 2013-09-01 LAB — METABOLIC PANEL, COMPREHENSIVE
A-G Ratio: 0.9 — ABNORMAL LOW (ref 1.2–3.5)
ALT (SGPT): 14 U/L (ref 12–65)
AST (SGOT): 15 U/L (ref 15–37)
Albumin: 3.7 g/dL (ref 3.5–5.0)
Alk. phosphatase: 63 U/L (ref 50–136)
Anion gap: 10 mmol/L (ref 7–16)
BUN: 12 MG/DL (ref 6–23)
Bilirubin, total: 0.7 MG/DL (ref 0.2–1.1)
CO2: 26 mmol/L (ref 21–32)
Calcium: 8.9 MG/DL (ref 8.3–10.4)
Chloride: 105 mmol/L (ref 98–107)
Creatinine: 1 MG/DL (ref 0.6–1.0)
GFR est AA: >60 mL/min/{1.73_m2} (ref >60)
GFR est non-AA: >60 mL/min/{1.73_m2} (ref >60)
Globulin: 3.9 g/dL — ABNORMAL HIGH (ref 2.3–3.5)
Glucose: 88 mg/dL (ref 65–100)
Potassium: 4.5 mmol/L (ref 3.5–5.1)
Protein, total: 7.6 g/dL (ref 6.3–8.2)
Sodium: 141 mmol/L (ref 136–145)

## 2013-09-01 LAB — CBC WITH AUTOMATED DIFF
ABS. BASOPHILS: 0 10*3/uL (ref 0.0–0.2)
ABS. EOSINOPHILS: 0 10*3/uL (ref 0.0–0.8)
ABS. IMM. GRANS.: 0 10*3/uL (ref 0.0–0.5)
ABS. LYMPHOCYTES: 0.9 10*3/uL (ref 0.5–4.6)
ABS. MONOCYTES: 0.9 10*3/uL (ref 0.1–1.3)
ABS. NEUTROPHILS: 14.8 10*3/uL — ABNORMAL HIGH (ref 1.7–8.2)
BASOPHILS: 0 % (ref 0.0–2.0)
EOSINOPHILS: 0 % — ABNORMAL LOW (ref 0.5–7.8)
HCT: 43.6 % (ref 35.8–46.3)
HGB: 14.6 g/dL (ref 11.7–15.4)
IMMATURE GRANULOCYTES: 0.2 % (ref 0.0–5.0)
LYMPHOCYTES: 5 % — ABNORMAL LOW (ref 13–44)
MCH: 29.1 PG (ref 26.1–32.9)
MCHC: 33.5 g/dL (ref 31.4–35.0)
MCV: 87 FL (ref 79.6–97.8)
MONOCYTES: 5 % (ref 4.0–12.0)
MPV: 11 FL (ref 10.8–14.1)
NEUTROPHILS: 90 % — ABNORMAL HIGH (ref 43–78)
PLATELET: 187 10*3/uL (ref 150–450)
RBC: 5.01 M/uL (ref 4.05–5.25)
RDW: 14.1 % (ref 11.9–14.6)
WBC: 16.6 10*3/uL — ABNORMAL HIGH (ref 4.3–11.1)

## 2013-09-01 LAB — WET PREP: Wet prep: 40

## 2013-09-01 LAB — HCG URINE, QL. - POC: Pregnancy test,urine (POC): NEGATIVE

## 2013-09-01 LAB — URINE MICROSCOPIC: Bacteria: 0 /hpf

## 2013-09-01 LAB — LIPASE: Lipase: 70 U/L — ABNORMAL LOW (ref 73–393)

## 2013-09-01 MED ADMIN — saline peripheral flush soln 10 mL: @ 07:00:00 | NDC 87701099893

## 2013-09-01 MED ADMIN — ceftriaxone (ROCEPHIN) 250 mg in lidocaine (XYLOCAINE) 10 mg/mL (1 %) IM syringe: INTRAMUSCULAR | @ 11:00:00 | NDC 00781320685

## 2013-09-01 MED ADMIN — ondansetron (ZOFRAN) injection 4 mg: INTRAVENOUS | @ 07:00:00 | NDC 23155037831

## 2013-09-01 MED ADMIN — sodium chloride 0.9 % bolus infusion 100 mL: INTRAVENOUS | @ 07:00:00 | NDC 00409798423

## 2013-09-01 MED ADMIN — ioversol (OPTIRAY) 350 mg iodine/mL contrast solution 125 mL: INTRAVENOUS | @ 07:00:00 | NDC 00019133311

## 2013-09-01 MED ADMIN — ketorolac (TORADOL) injection 30 mg: INTRAVENOUS | @ 10:00:00 | NDC 00409379501

## 2013-09-01 MED ADMIN — azithromycin (ZITHROMAX) powder 1 g: ORAL | @ 11:00:00 | NDC 59762305101

## 2013-09-01 MED ADMIN — morphine injection 2 mg: INTRAVENOUS | @ 07:00:00 | NDC 00409189001

## 2013-09-01 MED ADMIN — hyoscyamine SL (LEVSIN/SL) tablet 0.125 mg: SUBLINGUAL | @ 07:00:00 | NDC 76439030910

## 2013-09-01 NOTE — ED Provider Notes (Signed)
Patient is a 22 y.o. female presenting with abdominal pain. The history is provided by the patient.   Abdominal Pain   This is a new problem. The current episode started 6 to 12 hours ago. The problem occurs constantly. The problem has not changed since onset.The pain is associated with an unknown factor. The pain is located in the RLQ. The pain is at a severity of 10/10. The pain is severe. Associated symptoms include nausea and vomiting. Pertinent negatives include no anorexia, no diarrhea and no constipation. The pain is worsened by palpation and certain positions. The pain is relieved by nothing. Her past medical history does not include PUD, gallstones, GERD, ulcerative colitis, Crohn's disease, irritable bowel syndrome or pancreatitis. The patient's surgical history non-contributory.       Past Medical History   Diagnosis Date   ??? Chlamydia      treated in Feb 2014        Past Surgical History   Procedure Laterality Date   ??? Hx tonsillectomy     ??? Hx other surgical           Family History   Problem Relation Age of Onset   ??? Hypertension Maternal Grandmother         History     Social History   ??? Marital Status: SINGLE     Spouse Name: N/A     Number of Children: N/A   ??? Years of Education: N/A     Occupational History   ??? Not on file.     Social History Main Topics   ??? Smoking status: Current Every Day Smoker -- 0.50 packs/day     Last Attempt to Quit: 07/10/2012   ??? Smokeless tobacco: Never Used   ??? Alcohol Use: No   ??? Drug Use: No   ??? Sexually Active: Yes -- Female partner(s)     Birth Control/ Protection: None     Other Topics Concern   ??? Not on file     Social History Narrative   ??? No narrative on file                  ALLERGIES: Review of patient's allergies indicates no known allergies.      Review of Systems   Constitutional: Negative.  Negative for activity change.   HENT: Negative.    Eyes: Negative.    Respiratory: Negative.    Cardiovascular: Negative.    Gastrointestinal: Positive for nausea,  vomiting and abdominal pain. Negative for diarrhea, constipation and anorexia.   Genitourinary: Negative.    Musculoskeletal: Negative.    Skin: Negative.    Neurological: Negative.    Psychiatric/Behavioral: Negative.    All other systems reviewed and are negative.        Filed Vitals:    09/01/13 0211 09/01/13 0239 09/01/13 0300 09/01/13 0421   BP:  144/72 138/75 132/63   Pulse:  88 90    Temp:       Resp:  16 16    Height:       Weight:       SpO2: 98% 99% 99% 99%            Physical Exam   Nursing note and vitals reviewed.  Constitutional: She is oriented to person, place, and time. She appears well-developed and well-nourished.   HENT:   Head: Normocephalic and atraumatic.   Right Ear: External ear normal.   Left Ear: External ear normal.   Eyes: Conjunctivae  and EOM are normal. Pupils are equal, round, and reactive to light.   Neck: Normal range of motion. Neck supple.   Cardiovascular: Normal rate, regular rhythm and intact distal pulses.    Pulmonary/Chest: Effort normal and breath sounds normal.   Abdominal: Soft. Bowel sounds are normal.   Genitourinary: There is no rash, tenderness, lesion or injury on the right labia. There is no rash, tenderness, lesion or injury on the left labia. Cervix exhibits no motion tenderness, no discharge and no friability. Right adnexum displays no mass, no tenderness and no fullness. Left adnexum displays no mass and no tenderness. There is bleeding around the vagina. No erythema or tenderness around the vagina. No foreign body around the vagina. No signs of injury around the vagina. No vaginal discharge found.   Musculoskeletal: Normal range of motion.   Neurological: She is alert and oriented to person, place, and time. No cranial nerve deficit.   Skin: Skin is warm and dry.   Psychiatric: She has a normal mood and affect.        MDM     Differential Diagnosis; Clinical Impression; Plan:     Ct and USnegative will tx UTI and suggest follow up in 24-48 hours      Amount  and/or Complexity of Data Reviewed:   Clinical lab tests:  Ordered and reviewed  Tests in the radiology section of CPT??:  Ordered and reviewed  Tests in the medicine section of the CPT??:  Ordered and reviewed   Independant visualization of image, tracing, or specimen:  Yes  Risk of Significant Complications, Morbidity, and/or Mortality:   Presenting problems:  High  Diagnostic procedures:  High  Management options:  High  Progress:   Patient progress:  Resolved      Procedures

## 2013-09-01 NOTE — ED Notes (Signed)
Pt given 5 cups of water to fill her bladder for pelvic US, with instructions not to urinate.

## 2013-09-01 NOTE — ED Notes (Signed)
The patient was given their discharge instructions and  was given prescriptions.   The  patient verbalized understanding and had no additional questions. The patient was alert and was discharged via Ambulatory, without additional complaints at time of discharge.  No apparent distress noted

## 2013-09-04 LAB — CHLAMYDIA / GC-AMPLIFIED
Chlamydia trachomatis, NAA: NEGATIVE
Neisseria gonorrhoeae, NAA: POSITIVE — AB

## 2013-09-05 NOTE — Progress Notes (Signed)
Quick Note:    Treated appropriately in ED. Number was disconnected. Will send letter.  ______

## 2014-06-28 LAB — METABOLIC PANEL, COMPREHENSIVE
A-G Ratio: 0.8 — ABNORMAL LOW (ref 1.2–3.5)
ALT (SGPT): 21 U/L (ref 12–65)
AST (SGOT): 19 U/L (ref 15–37)
Albumin: 3.3 g/dL — ABNORMAL LOW (ref 3.5–5.0)
Alk. phosphatase: 70 U/L (ref 50–136)
Anion gap: 11 mmol/L (ref 7–16)
BUN: 10 MG/DL (ref 6–23)
Bilirubin, total: 0.3 MG/DL (ref 0.2–1.1)
CO2: 26 mmol/L (ref 21–32)
Calcium: 8.9 MG/DL (ref 8.3–10.4)
Chloride: 107 mmol/L (ref 98–107)
Creatinine: 1 MG/DL (ref 0.6–1.0)
GFR est AA: >60 mL/min/{1.73_m2} (ref >60)
GFR est non-AA: >60 mL/min/{1.73_m2} (ref >60)
Globulin: 3.9 g/dL — ABNORMAL HIGH (ref 2.3–3.5)
Glucose: 86 mg/dL (ref 65–100)
Potassium: 3.8 mmol/L (ref 3.5–5.1)
Protein, total: 7.2 g/dL (ref 6.3–8.2)
Sodium: 144 mmol/L (ref 136–145)

## 2014-06-28 LAB — CBC WITH AUTOMATED DIFF
ABS. BASOPHILS: 0 10*3/uL (ref 0.0–0.2)
ABS. EOSINOPHILS: 0 10*3/uL (ref 0.0–0.8)
ABS. IMM. GRANS.: 0 10*3/uL (ref 0.0–0.5)
ABS. LYMPHOCYTES: 1.2 10*3/uL (ref 0.5–4.6)
ABS. MONOCYTES: 0.3 10*3/uL (ref 0.1–1.3)
ABS. NEUTROPHILS: 10 10*3/uL — ABNORMAL HIGH (ref 1.7–8.2)
BASOPHILS: 0 % (ref 0.0–2.0)
EOSINOPHILS: 0 % — ABNORMAL LOW (ref 0.5–7.8)
HCT: 42.9 % (ref 35.8–46.3)
HGB: 14.5 g/dL (ref 11.7–15.4)
IMMATURE GRANULOCYTES: 0.3 % (ref 0.0–5.0)
LYMPHOCYTES: 10 % — ABNORMAL LOW (ref 13–44)
MCH: 28.8 PG (ref 26.1–32.9)
MCHC: 33.8 g/dL (ref 31.4–35.0)
MCV: 85.1 FL (ref 79.6–97.8)
MONOCYTES: 2 % — ABNORMAL LOW (ref 4.0–12.0)
MPV: 10.6 FL — ABNORMAL LOW (ref 10.8–14.1)
NEUTROPHILS: 88 % — ABNORMAL HIGH (ref 43–78)
PLATELET: 218 10*3/uL (ref 150–450)
RBC: 5.04 M/uL (ref 4.05–5.25)
RDW: 13.3 % (ref 11.9–14.6)
WBC: 11.5 10*3/uL — ABNORMAL HIGH (ref 4.3–11.1)

## 2014-06-28 LAB — URINE MICROSCOPIC
Casts: 0 /lpf
Crystals, urine: 0 /LPF
RBC: 0 /hpf

## 2014-06-28 LAB — HCG URINE, QL. - POC: Pregnancy test,urine (POC): NEGATIVE

## 2014-06-28 MED ORDER — HYDROCODONE-ACETAMINOPHEN 5 MG-325 MG TAB
5-325 mg | ORAL_TABLET | Freq: Four times a day (QID) | ORAL | Status: DC | PRN
Start: 2014-06-28 — End: 2014-10-03

## 2014-06-28 MED ORDER — CIPROFLOXACIN 500 MG TAB
500 mg | ORAL_TABLET | Freq: Two times a day (BID) | ORAL | Status: AC
Start: 2014-06-28 — End: 2014-07-05

## 2014-06-28 MED ORDER — PHENAZOPYRIDINE 200 MG TAB
200 mg | ORAL_TABLET | Freq: Three times a day (TID) | ORAL | Status: AC
Start: 2014-06-28 — End: 2014-06-30

## 2014-06-28 MED ORDER — DIPHENOXYLATE-ATROPINE 2.5 MG-0.025 MG TAB
ORAL | Status: AC
Start: 2014-06-28 — End: 2014-06-28
  Administered 2014-06-28: 18:00:00 via ORAL

## 2014-06-28 MED ORDER — SODIUM CHLORIDE 0.9% BOLUS IV
0.9 % | Freq: Once | INTRAVENOUS | Status: AC
Start: 2014-06-28 — End: 2014-06-28
  Administered 2014-06-28: 17:00:00 via INTRAVENOUS

## 2014-06-28 MED FILL — DIPHENOXYLATE-ATROPINE 2.5 MG-0.025 MG TAB: ORAL | Qty: 2

## 2014-06-28 NOTE — ED Notes (Signed)
I have reviewed discharge instructions with the patient. Printed instructions and several prescriptions given. The patient verbalized understanding. Pt left ambulatory without questions with her family.

## 2014-06-28 NOTE — ED Notes (Signed)
PMD-None. Pt c/o nausea, vomiting and lower abdominal pain that started last night.

## 2014-06-28 NOTE — ED Notes (Signed)
Patient presents with lower abdominal pain which she states is intermittent and when it is intensified she will then vomit and then feels better. She denies pain with urination. And denies difficulty with BM.

## 2014-07-02 NOTE — ED Provider Notes (Signed)
HPI Comments: 23 year old female presents emergency Department: Crampy lower abdominal pain.  Started yesterday.  No alleviating.  Pain comes and goes. No specific aggravating or inciting factors    No dysuria.  No fever or chills    Patient is a 23 y.o. female presenting with abdominal pain.   Abdominal Pain   Pertinent negatives include no fever and no nausea.        Past Medical History   Diagnosis Date   ??? Chlamydia      treated in Feb 2014        Past Surgical History   Procedure Laterality Date   ??? Hx tonsillectomy     ??? Hx other surgical           Family History   Problem Relation Age of Onset   ??? Hypertension Maternal Grandmother         History     Social History   ??? Marital Status: SINGLE     Spouse Name: N/A     Number of Children: N/A   ??? Years of Education: N/A     Occupational History   ??? Not on file.     Social History Main Topics   ??? Smoking status: Current Every Day Smoker -- 0.50 packs/day     Last Attempt to Quit: 07/10/2012   ??? Smokeless tobacco: Never Used   ??? Alcohol Use: No   ??? Drug Use: No   ??? Sexual Activity:     Partners: Male     Pharmacist, hospital Protection: None     Other Topics Concern   ??? Not on file     Social History Narrative                  ALLERGIES: Review of patient's allergies indicates no known allergies.      Review of Systems   Constitutional: Negative for fever and chills.   Gastrointestinal: Positive for abdominal pain. Negative for nausea.       Filed Vitals:    06/28/14 1135 06/28/14 1547   BP: 121/68 117/62   Pulse: 65 61   Temp: 98.4 ??F (36.9 ??C) 98 ??F (36.7 ??C)   Resp: 61 58   Height:  (1.753 m)    Weight: 90.719 kg (200 lb)    SpO2: 100% 100%            Physical Exam   Constitutional: She is oriented to person, place, and time. She appears well-developed and well-nourished. No distress.   HENT:   Head: Normocephalic.   Eyes: Pupils are equal, round, and reactive to light.   Cardiovascular: Normal rate and regular rhythm.     Pulmonary/Chest: Breath sounds normal. No respiratory distress.   Abdominal: She exhibits no distension. There is no tenderness.   Neurological: She is alert and oriented to person, place, and time.   Skin: Skin is warm and dry.   Nursing note and vitals reviewed.       MDM  Number of Diagnoses or Management Options  Acute cystitis without hematuria:   Diagnosis management comments: Well-appearing 23 year old female complains of crampy lower abdominal pain.  Intermittently for the last several hours.  No alleviating or aggravating or inciting factors.    She appears well and nontoxic    Urinalysis is abnormal showing infection.  We'll treat.  Discussed with patient //will discharge her home      Procedures

## 2014-10-03 ENCOUNTER — Inpatient Hospital Stay
Admit: 2014-10-03 | Discharge: 2014-10-03 | Disposition: A | Discharge status: Home / Self Care | Payer: Self-pay | Attending: Emergency Medicine

## 2014-10-03 DIAGNOSIS — R109 Unspecified abdominal pain: Secondary | ICD-10-CM

## 2014-10-03 LAB — METABOLIC PANEL, COMPREHENSIVE
A-G Ratio: 0.9 — ABNORMAL LOW (ref 1.2–3.5)
ALT (SGPT): 16 U/L (ref 12–65)
AST (SGOT): 16 U/L (ref 15–37)
Albumin: 3.5 g/dL (ref 3.5–5.0)
Alk. phosphatase: 62 U/L (ref 50–136)
Anion gap: 12 mmol/L (ref 7–16)
BUN: 10 MG/DL (ref 6–23)
Bilirubin, total: 0.6 MG/DL (ref 0.2–1.1)
CO2: 26 mmol/L (ref 21–32)
Calcium: 8.5 MG/DL (ref 8.3–10.4)
Chloride: 104 mmol/L (ref 98–107)
Creatinine: 0.84 MG/DL (ref 0.6–1.0)
GFR est AA: >60 mL/min/{1.73_m2} (ref >60)
GFR est non-AA: >60 mL/min/{1.73_m2} (ref >60)
Globulin: 3.7 g/dL — ABNORMAL HIGH (ref 2.3–3.5)
Glucose: 82 mg/dL (ref 65–100)
Potassium: 3 mmol/L — ABNORMAL LOW (ref 3.5–5.1)
Protein, total: 7.2 g/dL (ref 6.3–8.2)
Sodium: 142 mmol/L (ref 136–145)

## 2014-10-03 LAB — CBC WITH AUTOMATED DIFF
ABS. BASOPHILS: 0 10*3/uL (ref 0.0–0.2)
ABS. EOSINOPHILS: 0.2 10*3/uL (ref 0.0–0.8)
ABS. IMM. GRANS.: 0 10*3/uL (ref 0.0–0.5)
ABS. LYMPHOCYTES: 1.3 10*3/uL (ref 0.5–4.6)
ABS. MONOCYTES: 0.9 10*3/uL (ref 0.1–1.3)
ABS. NEUTROPHILS: 6.7 10*3/uL (ref 1.7–8.2)
BASOPHILS: 0 % (ref 0.0–2.0)
EOSINOPHILS: 2 % (ref 0.5–7.8)
HCT: 42.3 % (ref 35.8–46.3)
HGB: 14.3 g/dL (ref 11.7–15.4)
IMMATURE GRANULOCYTES: 0.2 % (ref 0.0–5.0)
LYMPHOCYTES: 14 % (ref 13–44)
MCH: 29.5 PG (ref 26.1–32.9)
MCHC: 33.8 g/dL (ref 31.4–35.0)
MCV: 87.2 FL (ref 79.6–97.8)
MONOCYTES: 10 % (ref 4.0–12.0)
MPV: 11.1 FL (ref 10.8–14.1)
NEUTROPHILS: 74 % (ref 43–78)
PLATELET: 172 10*3/uL (ref 150–450)
RBC: 4.85 M/uL (ref 4.05–5.25)
RDW: 13.3 % (ref 11.9–14.6)
WBC: 9.1 10*3/uL (ref 4.3–11.1)

## 2014-10-03 LAB — URINE MICROSCOPIC
Casts: 0 /lpf
Crystals, urine: 0 /LPF

## 2014-10-03 LAB — LIPASE: Lipase: 63 U/L — ABNORMAL LOW (ref 73–393)

## 2014-10-03 LAB — HCG URINE, QL. - POC: Pregnancy test,urine (POC): NEGATIVE

## 2014-10-03 MED ORDER — HYOSCYAMINE 0.125 MG SUBLINGUAL TAB
0.125 mg | SUBLINGUAL | Status: AC
Start: 2014-10-03 — End: 2014-10-03
  Administered 2014-10-03: 08:00:00 via SUBLINGUAL

## 2014-10-03 MED ORDER — CIPROFLOXACIN 500 MG TAB
500 mg | ORAL_TABLET | Freq: Two times a day (BID) | ORAL | Status: DC
Start: 2014-10-03 — End: 2015-01-07

## 2014-10-03 MED ORDER — PROMETHAZINE 25 MG/ML INJECTION
25 mg/mL | INTRAMUSCULAR | Status: AC
Start: 2014-10-03 — End: 2014-10-03
  Administered 2014-10-03: 09:00:00 via INTRAVENOUS

## 2014-10-03 MED ORDER — ONDANSETRON (PF) 4 MG/2 ML INJECTION
4 mg/2 mL | INTRAMUSCULAR | Status: AC
Start: 2014-10-03 — End: 2014-10-03
  Administered 2014-10-03: 09:00:00 via INTRAVENOUS

## 2014-10-03 MED ORDER — POTASSIUM CHLORIDE 10 % ORAL LIQUID
20 mEq/15 mL | ORAL | Status: AC
Start: 2014-10-03 — End: 2014-10-03
  Administered 2014-10-03: 10:00:00 via ORAL

## 2014-10-03 MED ORDER — POTASSIUM CHLORIDE 20 MEQ/100 ML IV PIGGY BACK
20 mEq/100 mL | Freq: Once | INTRAVENOUS | Status: DC
Start: 2014-10-03 — End: 2014-10-03

## 2014-10-03 MED ORDER — HYOSCYAMINE 0.125 MG SUBLINGUAL TAB
0.125 mg | ORAL_TABLET | Freq: Four times a day (QID) | SUBLINGUAL | Status: DC | PRN
Start: 2014-10-03 — End: 2015-01-07

## 2014-10-03 MED ORDER — PROMETHAZINE 25 MG TAB
25 mg | ORAL_TABLET | Freq: Four times a day (QID) | ORAL | Status: DC | PRN
Start: 2014-10-03 — End: 2015-01-07

## 2014-10-03 MED ORDER — CEFTRIAXONE 1 GRAM SOLUTION FOR INJECTION
1 gram | INTRAMUSCULAR | Status: DC
Start: 2014-10-03 — End: 2014-10-03

## 2014-10-03 MED ORDER — ADV ADDAPTOR
1 gram | Status: DC
Start: 2014-10-03 — End: 2014-10-03
  Administered 2014-10-03: 10:00:00 via INTRAVENOUS

## 2014-10-03 MED FILL — HYOSCYAMINE 0.125 MG SUBLINGUAL TAB: 0.125 mg | SUBLINGUAL | Qty: 2

## 2014-10-03 MED FILL — CEFTRIAXONE 1 GRAM SOLUTION FOR INJECTION: 1 gram | INTRAMUSCULAR | Qty: 1

## 2014-10-03 MED FILL — POTASSIUM CHLORIDE 10 % ORAL LIQUID: 20 mEq/15 mL | ORAL | Qty: 30

## 2014-10-03 MED FILL — ONDANSETRON (PF) 4 MG/2 ML INJECTION: 4 mg/2 mL | INTRAMUSCULAR | Qty: 2

## 2014-10-03 MED FILL — PROMETHAZINE 25 MG/ML INJECTION: 25 mg/mL | INTRAMUSCULAR | Qty: 1

## 2014-10-03 NOTE — ED Notes (Signed)
Complains of lower abdominal cramping with N/V/D x 3 days - states tonight worse - last LMP 1 month ago -

## 2014-10-03 NOTE — ED Provider Notes (Addendum)
HPI Comments: 23 year old female with three-day history of some suprapubic stabbing pain associated with some episodes of vomiting.  Has had loose bowel movements for 2 days as well.  No fever or chills or bleeding.  No back pain.  No abnormal vaginal discharge or bleeding.    Patient is a 23 y.o. female presenting with pelvic pain. The history is provided by the patient.   Pelvic Pain   This is a new problem. The current episode started more than 2 days ago. The problem occurs constantly. The problem has not changed since onset.The pain is associated with vomiting. The pain is located in the suprapubic region. The quality of the pain is sharp. The pain is moderate. Associated symptoms include diarrhea, nausea and vomiting. Pertinent negatives include no fever, no hematochezia, no melena, no constipation, no dysuria, no frequency, no headaches, no arthralgias, no chest pain and no back pain. Nothing worsens the pain. The pain is relieved by nothing. Her past medical history is significant for UTI. Her past medical history does not include PUD, pancreatitis, ovarian cysts, diverticulitis or kidney stones. The patient's surgical history non-contributory.       Past Medical History:   Diagnosis Date   ??? Chlamydia      treated in Feb 2014       Past Surgical History:   Procedure Laterality Date   ??? Hx tonsillectomy     ??? Hx other surgical           Family History:   Problem Relation Age of Onset   ??? Hypertension Maternal Grandmother        History     Social History   ??? Marital Status: SINGLE     Spouse Name: N/A     Number of Children: N/A   ??? Years of Education: N/A     Occupational History   ??? Not on file.     Social History Main Topics   ??? Smoking status: Current Every Day Smoker -- 0.50 packs/day     Last Attempt to Quit: 07/10/2012   ??? Smokeless tobacco: Never Used   ??? Alcohol Use: No   ??? Drug Use: No   ??? Sexual Activity:     Partners: Male     Patent examiner Protection: None     Other Topics Concern    ??? Not on file     Social History Narrative                ALLERGIES: Review of patient's allergies indicates no known allergies.      Review of Systems   Constitutional: Negative for fever and chills.   HENT: Negative for ear pain.    Respiratory: Negative for cough and shortness of breath.    Cardiovascular: Negative for chest pain and palpitations.   Gastrointestinal: Positive for nausea, vomiting and diarrhea. Negative for abdominal pain, constipation, melena and hematochezia.   Genitourinary: Negative for dysuria, frequency and flank pain.   Musculoskeletal: Negative for back pain, arthralgias and neck pain.   Skin: Negative for color change and rash.   Neurological: Negative for syncope and headaches.       Filed Vitals:    10/03/14 0251   BP: 148/72   Pulse: 77   Temp: 98.2 ??F (36.8 ??C)   Resp: 18   Height: $Remove'5\' 9"'[id number]$  (1.753 m)   Weight: 90.719 kg (200 lb)   SpO2: 98%            Physical Exam  Constitutional: She is oriented to person, place, and time. She appears well-developed and well-nourished. No distress.   HENT:   Head: Normocephalic and atraumatic.   Mouth/Throat: Oropharynx is clear and moist. No oropharyngeal exudate.   Eyes: Conjunctivae and EOM are normal. Pupils are equal, round, and reactive to light.   Neck: Normal range of motion. Neck supple.   Cardiovascular: Normal rate, regular rhythm and intact distal pulses.    No murmur heard.  Pulmonary/Chest: Breath sounds normal. No respiratory distress.   Abdominal: Soft. Bowel sounds are normal. She exhibits no mass. There is tenderness in the suprapubic area. There is no rebound, no guarding, no CVA tenderness, no tenderness at McBurney's point and negative Murphy's sign. No hernia.   Neurological: She is alert and oriented to person, place, and time. Gait normal.   Nl speech   Skin: Skin is warm and dry.   Psychiatric: She has a normal mood and affect. Her speech is normal.   Nursing note and vitals reviewed.       MDM   Number of Diagnoses or Management Options  Diagnosis management comments: Doubt appendicitis or cholecystitis.  Suspect UTI or viral syndrome.  Rule out pregnancy or complications       Amount and/or Complexity of Data Reviewed  Clinical lab tests: reviewed    Risk of Complications, Morbidity, and/or Mortality  Presenting problems: moderate  Diagnostic procedures: moderate  Management options: moderate    Patient Progress  Patient progress: stable      Procedures       no vomiting in the department.  Results Include:    Recent Results (from the past 24 hour(s))   HCG URINE, QL. - POC    Collection Time: 10/03/14  3:07 AM   Result Value Ref Range    Pregnancy test,urine (POC) NEGATIVE  NEG     CBC WITH AUTOMATED DIFF    Collection Time: 10/03/14  3:45 AM   Result Value Ref Range    WBC 9.1 4.3 - 11.1 K/uL    RBC 4.85 4.05 - 5.25 M/uL    HGB 14.3 11.7 - 15.4 g/dL    HCT 42.3 35.8 - 46.3 %    MCV 87.2 79.6 - 97.8 FL    MCH 29.5 26.1 - 32.9 PG    MCHC 33.8 31.4 - 35.0 g/dL    RDW 13.3 11.9 - 14.6 %    PLATELET 172 150 - 450 K/uL    MPV 11.1 10.8 - 14.1 FL    DF AUTOMATED      NEUTROPHILS 74 43 - 78 %    LYMPHOCYTES 14 13 - 44 %    MONOCYTES 10 4.0 - 12.0 %    EOSINOPHILS 2 0.5 - 7.8 %    BASOPHILS 0 0.0 - 2.0 %    IMMATURE GRANULOCYTES 0.2 0.0 - 5.0 %    ABS. NEUTROPHILS 6.7 1.7 - 8.2 K/UL    ABS. LYMPHOCYTES 1.3 0.5 - 4.6 K/UL    ABS. MONOCYTES 0.9 0.1 - 1.3 K/UL    ABS. EOSINOPHILS 0.2 0.0 - 0.8 K/UL    ABS. BASOPHILS 0.0 0.0 - 0.2 K/UL    ABS. IMM. GRANS. 0.0 0.0 - 0.5 K/UL   METABOLIC PANEL, COMPREHENSIVE    Collection Time: 10/03/14  3:45 AM   Result Value Ref Range    Sodium 142 136 - 145 mmol/L    Potassium 3.0 (L) 3.5 - 5.1 mmol/L    Chloride 104 98 - 107 mmol/L  CO2 26 21 - 32 mmol/L    Anion gap 12 7 - 16 mmol/L    Glucose 82 65 - 100 mg/dL    BUN 10 6 - 23 MG/DL    Creatinine 0.84 0.6 - 1.0 MG/DL    GFR est AA >60 >60 ml/min/1.4m    GFR est non-AA >60 >60 ml/min/1.738m   Calcium 8.5 8.3 - 10.4 MG/DL     Bilirubin, total 0.6 0.2 - 1.1 MG/DL    ALT 16 12 - 65 U/L    AST 16 15 - 37 U/L    Alk. phosphatase 62 50 - 136 U/L    Protein, total 7.2 6.3 - 8.2 g/dL    Albumin 3.5 3.5 - 5.0 g/dL    Globulin 3.7 (H) 2.3 - 3.5 g/dL    A-G Ratio 0.9 (L) 1.2 - 3.5     LIPASE    Collection Time: 10/03/14  3:45 AM   Result Value Ref Range    Lipase 63 (L) 73 - 393 U/L                 UA is borderline in regard to infection.  Patient states she's had infection symptoms like this before and it be due to UTI so she will be treated.

## 2014-10-03 NOTE — ED Notes (Signed)
Pt has not vomited or had a episode of diarrhea since arrival.

## 2014-10-03 NOTE — ED Notes (Signed)
I have reviewed discharge instructions with the patient.  The patient verbalized understanding.Prescriptions given and states will fill them later.

## 2014-10-03 NOTE — ED Notes (Signed)
Sleeping at intervals, boyfriend at bedside, denies n/v/d

## 2015-01-07 ENCOUNTER — Inpatient Hospital Stay
Admit: 2015-01-07 | Discharge: 2015-01-07 | Disposition: A | Discharge status: Home / Self Care | Payer: Self-pay | Attending: Emergency Medicine

## 2015-01-07 DIAGNOSIS — O26891 Other specified pregnancy related conditions, first trimester: Secondary | ICD-10-CM

## 2015-01-07 LAB — EKG, 12 LEAD, INITIAL
Atrial Rate: 103 {beats}/min
Calculated P Axis: 72 degrees
Calculated R Axis: 68 degrees
Calculated T Axis: 38 degrees
P-R Interval: 164 ms
Q-T Interval: 320 ms
QRS Duration: 86 ms
QTC Calculation (Bezet): 419 ms
Ventricular Rate: 103 {beats}/min

## 2015-01-07 MED ORDER — PROMETHAZINE 25 MG TAB
25 mg | ORAL_TABLET | Freq: Four times a day (QID) | ORAL | Status: DC | PRN
Start: 2015-01-07 — End: 2015-01-09

## 2015-01-07 MED ORDER — ALUM-MAG HYDROXIDE-SIMETH 200 MG-200 MG-20 MG/5 ML ORAL SUSP
200-200-20 mg/5 mL | ORAL | Status: AC
Start: 2015-01-07 — End: 2015-01-07
  Administered 2015-01-07: 11:00:00 via ORAL

## 2015-01-07 MED ORDER — ONDANSETRON 8 MG TAB, RAPID DISSOLVE
8 mg | ORAL | Status: AC
Start: 2015-01-07 — End: 2015-01-07
  Administered 2015-01-07: 12:00:00 via ORAL

## 2015-01-07 MED ORDER — PROMETHAZINE 25 MG/ML INJECTION
25 mg/mL | INTRAMUSCULAR | Status: AC
Start: 2015-01-07 — End: 2015-01-07
  Administered 2015-01-07: 11:00:00 via INTRAMUSCULAR

## 2015-01-07 MED ORDER — RANITIDINE 150 MG TAB
150 mg | ORAL_TABLET | Freq: Two times a day (BID) | ORAL | Status: AC
Start: 2015-01-07 — End: ?

## 2015-01-07 MED ORDER — MEPERIDINE (PF) 25 MG/ML INJ SOLUTION
25 mg/ml | INTRAMUSCULAR | Status: AC
Start: 2015-01-07 — End: 2015-01-07
  Administered 2015-01-07: 11:00:00 via INTRAMUSCULAR

## 2015-01-07 MED ORDER — PRENATAL VITAMIN,CALCIUM,MINERALS-IRON-FOLIC ACID TABLET
ORAL_TABLET | Freq: Every day | ORAL | Status: AC
Start: 2015-01-07 — End: ?

## 2015-01-07 MED FILL — PROMETHAZINE 25 MG/ML INJECTION: 25 mg/mL | INTRAMUSCULAR | Qty: 1

## 2015-01-07 MED FILL — MEPERIDINE (PF) 25MG/ML INJECTION: 25 mg/mL | INTRAMUSCULAR | Qty: 1

## 2015-01-07 MED FILL — ONDANSETRON 8 MG TAB, RAPID DISSOLVE: 8 mg | ORAL | Qty: 1

## 2015-01-07 MED FILL — MAG-AL PLUS 200 MG-200 MG-20 MG/5 ML ORAL SUSPENSION: 200-200-20 mg/5 mL | ORAL | Qty: 30

## 2015-01-07 NOTE — ED Notes (Signed)
Pt vomited. Explained phenergan not effective yet. Dr. Leonel RamsayFelder aware- zofran ordered.

## 2015-01-07 NOTE — ED Notes (Signed)
Monitoring pt comfort prior to discharge- [facility].

## 2015-01-07 NOTE — ED Notes (Signed)
Pt presents to ER for a myriad of complaints; sore throat and cough since yesterday, no known fever, nausea w/ "lots and lots of vomiting" per pt since 2300 last pm though states that she is [redacted] weeks pregnant, no OB visits at this time.  Pt c/o smell in room that is r/t the mop water by housekeeping w/ pt plugging her nose, pt tearful w/ exaggerated mannerisms while being triaged.

## 2015-01-07 NOTE — ED Notes (Signed)
I have reviewed discharge instructions with the patient.  The patient verbalized understanding. Patient was given prescription. Pt ambulatory upon discharge home.

## 2015-01-07 NOTE — ED Provider Notes (Addendum)
Patient is a 24 y.o. female presenting with sore throat and vomiting. The history is provided by the patient.   Sore Throat   This is a new problem. The problem has not changed since onset.There has been no fever. Associated symptoms include vomiting, congestion and trouble swallowing. Pertinent negatives include no diarrhea, no headaches, no shortness of breath and no cough. She has had no exposure to strep. She has tried nothing for the symptoms.   Vomiting   This is a new problem. The current episode started more than 1 week ago. Episode frequency: intermittently throughout her pregnancy. The problem has not changed since onset.The emesis has an appearance of stomach contents. There has been no fever. Pertinent negatives include no chills, no fever, no abdominal pain, no diarrhea, no headaches, no arthralgias, no cough and no headaches. Risk factors: First trimester pregnancy. Her pertinent negatives include no DM.        Past Medical History:   Diagnosis Date   ??? Chlamydia      treated in Feb 2014   ??? PIH (pregnancy induced hypertension)        Past Surgical History:   Procedure Laterality Date   ??? Hx tonsillectomy     ??? Hx other surgical           Family History:   Problem Relation Age of Onset   ??? Hypertension Maternal Grandmother        History     Social History   ??? Marital Status: SINGLE     Spouse Name: N/A   ??? Number of Children: N/A   ??? Years of Education: N/A     Occupational History   ??? Not on file.     Social History Main Topics   ??? Smoking status: Current Every Day Smoker -- 0.50 packs/day   ??? Smokeless tobacco: Never Used   ??? Alcohol Use: No   ??? Drug Use: No   ??? Sexual Activity:     Partners: Male     Pharmacist, hospital Protection: None     Other Topics Concern   ??? Not on file     Social History Narrative           ALLERGIES: Review of patient's allergies indicates no known allergies.      Review of Systems   Constitutional: Negative for fever and chills.    HENT: Positive for congestion, sore throat and trouble swallowing. Negative for rhinorrhea.    Eyes: Negative for discharge and redness.   Respiratory: Negative for cough and shortness of breath.    Cardiovascular: Positive for chest pain. Negative for palpitations.   Gastrointestinal: Positive for nausea and vomiting. Negative for abdominal pain and diarrhea.   Genitourinary: Negative for dysuria and urgency.   Musculoskeletal: Negative for back pain and arthralgias.   Skin: Negative for rash.   Neurological: Negative for dizziness and headaches.   All other systems reviewed and are negative.      Filed Vitals:    01/07/15 0603   BP: 122/71   Pulse: 103   Temp: 98.4 ??F (36.9 ??C)   Resp: 18   Height:  (1.753 m)   Weight: 90.719 kg (200 lb)   SpO2: 98%            Physical Exam   Constitutional: She is oriented to person, place, and time. She appears well-developed and well-nourished. She appears distressed.   HENT:   Head: Normocephalic and atraumatic.   Eyes: Conjunctivae are  normal. Pupils are equal, round, and reactive to light. Right eye exhibits no discharge. Left eye exhibits no discharge. No scleral icterus.   Neck: Normal range of motion. Neck supple.   Cardiovascular: Normal rate, regular rhythm and normal heart sounds.  Exam reveals no gallop.    No murmur heard.  Pulmonary/Chest: Effort normal and breath sounds normal. No respiratory distress. She has no wheezes. She has no rales.   Abdominal: Soft. Bowel sounds are normal. There is no tenderness. There is no guarding.   Musculoskeletal: Normal range of motion. She exhibits no edema.   Neurological: She is alert and oriented to person, place, and time. She exhibits normal muscle tone.   cni 2-12 grossly   Skin: Skin is warm and dry. She is not diaphoretic.   Psychiatric: Her behavior is normal. Her mood appears anxious.   Nursing note and vitals reviewed.       MDM  Number of Diagnoses or Management Options   Diagnosis management comments: Chest pain improved after Mylanta, nausea worse.  Administer small doses of Demerol Phenergan IM  Patient is very anxious and worked up, boyfriend laying on the floor in ER room 11 taking a nap      Procedures

## 2015-01-08 ENCOUNTER — Ambulatory Visit: Admit: 2015-01-08 | Discharge: 2015-01-08 | Attending: Obstetrics & Gynecology

## 2015-01-08 DIAGNOSIS — R112 Nausea with vomiting, unspecified: Secondary | ICD-10-CM

## 2015-01-08 NOTE — Progress Notes (Signed)
HISTORY OF PRESENT ILLNESS    HPI   Donna Jensen is a 24 y.o. [id number] presenting today for an ER follow up for nausea and vomiting in early pregnancy.  According to LMP of 11/27/14 she is approximately [redacted] weeks gestational age with EDC of 09/03/15. She has a h/o hyperemesis with her last pregnancy requiring admission. She presented to the ER with sore throat (resolved with Mylanta) and vomiting- got zofran and phenergan. Vomiting stopped this am around 230 and she has been able to tolerate crackers and fluids. No sick contacts.     She was delivered by Dr. Samuella Cota last pregnancy.     Consipated- using mirilax.       Gyn history: Menarche:12-13  ,Interval: 28 days, Duration 4 days  History of abnormal paps: No   History of STIs: chlamydia 12/2012, GC 08/2013    Sexually active: Yes Female ,Contraception: None   Pain with intercourse: No ,Bleeding with sex: No  Intermenstrual bleeding: No    Obstetrics history:   G1 Term NSVD Female 2009 c/b preeclampsia  G2 Term NSVD Female 2014 c/b ? preeclampsia and hyperemesis  G3 current    Past Medical History   Diagnosis Date   ??? Chlamydia      treated in feb 2014   ??? PIH (pregnancy induced hypertension)      with both pregnancies   ??? Gonorrhea      tx october 2014       Past Surgical History   Procedure Laterality Date   ??? Hx tonsillectomy     ??? Hx other surgical         Family History   Problem Relation Age of Onset   ??? Hypertension Maternal Grandmother        History     Social History   ??? Marital Status: SINGLE     Spouse Name: N/A   ??? Number of Children: N/A   ??? Years of Education: N/A     Occupational History   ??? Not on file.     Social History Main Topics   ??? Smoking status: Current Every Day Smoker -- 0.50 packs/day   ??? Smokeless tobacco: Never Used   ??? Alcohol Use: No   ??? Drug Use: Yes      Comment: THC   ??? Sexual Activity:     Partners: Male     Pharmacist, hospital Protection: None     Other Topics Concern   ??? Not on file     Social History Narrative        No results found for this or any previous visit (from the past 24 hour(s)).  BP 112/60 mmHg   Ht  (1.753 m)   Wt 224 lb (101.606 kg)   BMI 33.06 kg/m2   LMP 11/27/2014   Breastfeeding? Unknown      Review of Systems   Constitutional: Negative.    HENT: Negative.    Eyes: Negative.    Respiratory: Negative.    Cardiovascular: Negative.    Gastrointestinal: Positive for nausea, vomiting and constipation. Negative for abdominal pain, diarrhea and blood in stool.   Genitourinary: Negative.    Musculoskeletal: Negative.    Skin: Negative.    Neurological: Negative.    Endo/Heme/Allergies: Negative.    Psychiatric/Behavioral: Negative.        Physical Exam   Constitutional: She is oriented to person, place, and time. She appears well-developed and well-nourished. No distress.   HENT:   Head: Normocephalic  and atraumatic.   Right Ear: External ear normal.   Left Ear: External ear normal.   Nose: Nose normal.   Eyes: EOM are normal. Pupils are equal, round, and reactive to light. Right eye exhibits no discharge. Left eye exhibits no discharge. No scleral icterus.   Neck: Normal range of motion. Neck supple. No tracheal deviation present. No thyromegaly present.   Cardiovascular: Normal rate, regular rhythm, normal heart sounds and intact distal pulses.  Exam reveals no gallop and no friction rub.    No murmur heard.  Pulmonary/Chest: Effort normal and breath sounds normal. No stridor. No respiratory distress. She has no wheezes. She has no rales. She exhibits no tenderness.   Abdominal: Soft. She exhibits no distension and no mass. There is no tenderness. There is no rebound and no guarding.   Musculoskeletal: Normal range of motion. She exhibits no edema or tenderness.   Lymphadenopathy:     She has no cervical adenopathy.   Neurological: She is alert and oriented to person, place, and time.   Skin: Skin is warm and dry. No rash noted. She is not diaphoretic. No erythema. No pallor.    Psychiatric: She has a normal mood and affect. Her behavior is normal.   Vitals reviewed.      ASSESSMENT and PLAN  Donna Jensen is a 24 y.o. [phone] presenting for an ER follow up for nausea and vomiting in pregnancy  1) N/V- h/o hyperemesis; however, she has had a sore throat. Possible this is viral. She received Rx for pheneragan and zofran from the ER. Given samples for diclegis. She is unsure who she will f/u with for OB care (saw Dr. Samuella Jensen last pregnancy).   2) Encouraged to est care asap

## 2015-01-09 ENCOUNTER — Inpatient Hospital Stay
Admit: 2015-01-09 | Discharge: 2015-01-09 | Disposition: A | Discharge status: Home / Self Care | Payer: MEDICAID | Attending: Emergency Medicine

## 2015-01-09 DIAGNOSIS — O210 Mild hyperemesis gravidarum: Secondary | ICD-10-CM

## 2015-01-09 LAB — CBC W/O DIFF
HCT: 44.1 % (ref 35.8–46.3)
HGB: 14.8 g/dL (ref 11.7–15.4)
MCH: 29.1 PG (ref 26.1–32.9)
MCHC: 33.6 g/dL (ref 31.4–35.0)
MCV: 86.8 FL (ref 79.6–97.8)
MPV: 10.8 FL (ref 10.8–14.1)
PLATELET: 167 10*3/uL (ref 150–450)
RBC: 5.08 M/uL (ref 4.05–5.25)
RDW: 13.2 % (ref 11.9–14.6)
WBC: 6.4 10*3/uL (ref 4.3–11.1)

## 2015-01-09 LAB — METABOLIC PANEL, BASIC
Anion gap: 10 mmol/L (ref 7–16)
BUN: 10 MG/DL (ref 6–23)
CO2: 26 mmol/L (ref 21–32)
Calcium: 8.5 MG/DL (ref 8.3–10.4)
Chloride: 103 mmol/L (ref 98–107)
Creatinine: 0.67 MG/DL (ref 0.6–1.0)
GFR est AA: >60 mL/min/{1.73_m2} (ref >60)
GFR est non-AA: >60 mL/min/{1.73_m2} (ref >60)
Glucose: 78 mg/dL (ref 65–100)
Potassium: 3.9 mmol/L (ref 3.5–5.1)
Sodium: 139 mmol/L (ref 136–145)

## 2015-01-09 MED ORDER — LACTATED RINGERS IV
INTRAVENOUS | Status: AC
Start: 2015-01-09 — End: 2015-01-09
  Administered 2015-01-09: 19:00:00 via INTRAVENOUS

## 2015-01-09 MED ORDER — SODIUM CHLORIDE 0.9 % IJ SYRG
Freq: Three times a day (TID) | INTRAMUSCULAR | Status: DC
Start: 2015-01-09 — End: 2015-01-09

## 2015-01-09 MED ORDER — SODIUM CHLORIDE 0.9 % IJ SYRG
INTRAMUSCULAR | Status: DC | PRN
Start: 2015-01-09 — End: 2015-01-09

## 2015-01-09 MED ORDER — ONDANSETRON 8 MG TAB, RAPID DISSOLVE
8 mg | ORAL_TABLET | Freq: Three times a day (TID) | ORAL | Status: AC | PRN
Start: 2015-01-09 — End: ?

## 2015-01-09 MED ORDER — ONDANSETRON 8 MG TAB, RAPID DISSOLVE
8 mg | ORAL_TABLET | Freq: Three times a day (TID) | ORAL | Status: DC | PRN
Start: 2015-01-09 — End: 2015-01-09

## 2015-01-09 NOTE — ED Notes (Signed)
I have reviewed discharge instructions with the patient.  The patient verbalized understanding.

## 2015-01-09 NOTE — ED Notes (Signed)
[redacted] weeks pregnant, with vomiting[id number]

## 2015-01-09 NOTE — ED Provider Notes (Signed)
Patient is a 24 y.o. female presenting with vomiting. The history is provided by the patient.   Vomiting   This is a recurrent problem. The current episode started 2 days ago. The problem occurs 2 to 4 times per day. The problem has not changed since onset.The emesis has an appearance of stomach contents. There has been no fever. Pertinent negatives include no chills, no fever, no abdominal pain, no diarrhea, no headaches, no myalgias, no cough and no headaches. Yes, the patient is pregnant.       Past Medical History:   Diagnosis Date   ??? Chlamydia      treated in feb 2014   ??? PIH (pregnancy induced hypertension)      with both pregnancies   ??? Gonorrhea      tx october 2014       Past Surgical History:   Procedure Laterality Date   ??? Hx tonsillectomy     ??? Hx other surgical           Family History:   Problem Relation Age of Onset   ??? Hypertension Maternal Grandmother        History     Social History   ??? Marital Status: SINGLE     Spouse Name: N/A   ??? Number of Children: N/A   ??? Years of Education: N/A     Occupational History   ??? Not on file.     Social History Main Topics   ??? Smoking status: Current Every Day Smoker -- 0.50 packs/day   ??? Smokeless tobacco: Never Used   ??? Alcohol Use: No   ??? Drug Use: Yes      Comment: THC   ??? Sexual Activity:     Partners: Male     Pharmacist, hospital Protection: None     Other Topics Concern   ??? Not on file     Social History Narrative           ALLERGIES: Review of patient's allergies indicates no known allergies.      Review of Systems   Constitutional: Negative for fever and chills.   HENT: Negative for congestion, rhinorrhea and sore throat.    Eyes: Negative for photophobia and redness.   Respiratory: Negative for cough and shortness of breath.    Cardiovascular: Negative for chest pain and leg swelling.   Gastrointestinal: Positive for vomiting. Negative for nausea, abdominal pain and diarrhea.   Endocrine: Negative for polydipsia and polyuria.    Genitourinary: Negative for dysuria.   Musculoskeletal: Negative for myalgias and back pain.   Neurological: Negative for weakness, numbness and headaches.       Filed Vitals:    01/09/15 1336   BP: 123/67   Pulse: 90   Temp: 98.1 ??F (36.7 ??C)   Resp: 16   Height: 5\' 9"  (1.753 m)   Weight: 90.719 kg (200 lb)   SpO2: 98%            Physical Exam   Constitutional: She is oriented to person, place, and time. She appears well-developed and well-nourished.   Eyes: Conjunctivae are normal. Pupils are equal, round, and reactive to light.   Neck: Normal range of motion. Neck supple.   Cardiovascular: Normal rate, regular rhythm and normal heart sounds.    No murmur heard.  Pulmonary/Chest: Breath sounds normal. No respiratory distress.   Abdominal: Soft. She exhibits no distension. There is no tenderness. There is no rebound and no guarding.   Musculoskeletal:  Normal range of motion. She exhibits no edema or tenderness.   Neurological: She is alert and oriented to person, place, and time. She has normal strength. No sensory deficit.   Skin: Skin is warm and dry.        MDM  Number of Diagnoses or Management Options  Diagnosis management comments: Hyperemesis gravidarum.  Patient does not appear dehydrated clinically.  We will hydrate and check labs.  No vaginal bleeding or abdominal pain.       Amount and/or Complexity of Data Reviewed  Clinical lab tests: ordered and reviewed (Results for orders placed or performed during the hospital encounter of 01/09/15  -CBC W/O DIFF       Result                                            Value                         Ref Range                       WBC                                               6.4                           4.3 - 11.1 K/uL                 RBC                                               5.08                          4.05 - 5.25 M/uL                HGB                                               14.8                          11.7 - 15.4 g/dL                 HCT                                               44.1                          35.8 - 46.3 %                   MCV  86.8                          79.6 - 97.8 FL                  MCH                                               29.1                          26.1 - 32.9 PG                  MCHC                                              33.6                          31.4 - 35.0 g/dL                RDW                                               13.2                          11.9 - 14.6 %                   PLATELET                                          167                           150 - 450 K/uL                  MPV                                               10.8                          10.8 - 14.1 FL             -METABOLIC PANEL, BASIC       Result                                            Value                         Ref Range  Sodium                                            139                           136 - 145 mmol/L                Potassium                                         3.9                           3.5 - 5.1 mmol/L                Chloride                                          103                           98 - 107 mmol/L                 CO2                                               26                            21 - 32 mmol/L                  Anion gap                                         10                            7 - 16 mmol/L                   Glucose                                           78                            65 - 100 mg/dL                  BUN                                               10  6 - 23 MG/DL                    Creatinine                                        0.67                          0.6 - 1.0 MG/DL                 GFR est AA                                        >60                           >60 ml/min/1.51m2                GFR est non-AA                                    >60                           >60 ml/min/1.54m2               Calcium                                           8.5                           8.3 - 10.4 MG/DL           )        Procedures

## 2015-01-11 ENCOUNTER — Inpatient Hospital Stay
Admit: 2015-01-11 | Discharge: 2015-01-11 | Disposition: A | Discharge status: Home / Self Care | Payer: MEDICAID | Attending: Emergency Medicine

## 2015-01-11 DIAGNOSIS — O200 Threatened abortion: Secondary | ICD-10-CM

## 2015-01-11 LAB — BLOOD TYPE, (ABO+RH)
ABO/Rh(D): O POS
ABO/Rh: O POS

## 2015-01-11 LAB — BETA HCG, QT
Beta HCG, QT: 22267 m[IU]/mL — ABNORMAL HIGH (ref 0.0–6.0)
hCG Quant: 22267 m[IU]/mL — ABNORMAL HIGH (ref 0.0–6.0)

## 2015-01-11 LAB — METABOLIC PANEL, COMPREHENSIVE
A-G Ratio: 0.9 — ABNORMAL LOW (ref 1.2–3.5)
ALT (SGPT): 27 U/L (ref 12–65)
AST (SGOT): 31 U/L (ref 15–37)
Albumin: 3.2 g/dL — ABNORMAL LOW (ref 3.5–5.0)
Alk. phosphatase: 53 U/L (ref 50–136)
Anion gap: 10 mmol/L (ref 7–16)
BUN: 6 MG/DL (ref 6–23)
Bilirubin, total: 0.2 MG/DL (ref 0.2–1.1)
CO2: 27 mmol/L (ref 21–32)
Calcium: 8.4 MG/DL (ref 8.3–10.4)
Chloride: 107 mmol/L (ref 98–107)
Creatinine: 0.69 MG/DL (ref 0.6–1.0)
GFR est AA: >60 mL/min/{1.73_m2} (ref >60)
GFR est non-AA: >60 mL/min/{1.73_m2} (ref >60)
Globulin: 3.7 g/dL — ABNORMAL HIGH (ref 2.3–3.5)
Glucose: 87 mg/dL (ref 65–100)
Potassium: 3.1 mmol/L — ABNORMAL LOW (ref 3.5–5.1)
Protein, total: 6.9 g/dL (ref 6.3–8.2)
Sodium: 144 mmol/L (ref 136–145)

## 2015-01-11 LAB — WET PREP
Wet prep: 10
Wet prep: NONE SEEN

## 2015-01-11 LAB — CBC WITH AUTOMATED DIFF
ABS. BASOPHILS: 0 10*3/uL (ref 0.0–0.2)
ABS. EOSINOPHILS: 0 10*3/uL (ref 0.0–0.8)
ABS. IMM. GRANS.: 0 10*3/uL (ref 0.0–0.5)
ABS. LYMPHOCYTES: 0.8 10*3/uL (ref 0.5–4.6)
ABS. MONOCYTES: 0.5 10*3/uL (ref 0.1–1.3)
ABS. NEUTROPHILS: 3.2 10*3/uL (ref 1.7–8.2)
BASOPHILS: 0 % (ref 0.0–2.0)
EOSINOPHILS: 0 % — ABNORMAL LOW (ref 0.5–7.8)
HCT: 41.3 % (ref 35.8–46.3)
HGB: 14.1 g/dL (ref 11.7–15.4)
IMMATURE GRANULOCYTES: 0.2 % (ref 0.0–5.0)
LYMPHOCYTES: 18 % (ref 13–44)
MCH: 29.5 PG (ref 26.1–32.9)
MCHC: 34.1 g/dL (ref 31.4–35.0)
MCV: 86.4 FL (ref 79.6–97.8)
MONOCYTES: 11 % (ref 4.0–12.0)
MPV: 10.8 FL (ref 10.8–14.1)
NEUTROPHILS: 71 % (ref 43–78)
PLATELET: 143 10*3/uL — ABNORMAL LOW (ref 150–450)
RBC: 4.78 M/uL (ref 4.05–5.25)
RDW: 13.1 % (ref 11.9–14.6)
WBC: 4.6 10*3/uL (ref 4.3–11.1)

## 2015-01-11 LAB — URINE MICROSCOPIC
Bacteria: 0 /hpf
Casts: 0 /lpf
Mucus: 0 /lpf
RBC: 0 /hpf

## 2015-01-11 LAB — LIPASE: Lipase: 51 U/L — ABNORMAL LOW (ref 73–393)

## 2015-01-11 MED ORDER — SODIUM CHLORIDE 0.9% BOLUS IV
0.9 % | Freq: Once | INTRAVENOUS | Status: AC
Start: 2015-01-11 — End: 2015-01-11
  Administered 2015-01-11: 13:00:00 via INTRAVENOUS

## 2015-01-11 MED ORDER — ONDANSETRON (PF) 4 MG/2 ML INJECTION
4 mg/2 mL | INTRAMUSCULAR | Status: DC
Start: 2015-01-11 — End: 2015-01-11

## 2015-01-11 MED ORDER — CEFTRIAXONE 250 MG SOLUTION FOR INJECTION
250 mg | INTRAMUSCULAR | Status: AC
Start: 2015-01-11 — End: 2015-01-11
  Administered 2015-01-11: 14:00:00 via INTRAMUSCULAR

## 2015-01-11 MED ORDER — PROMETHAZINE 25 MG/ML INJECTION
25 mg/mL | INTRAMUSCULAR | Status: AC
Start: 2015-01-11 — End: 2015-01-11
  Administered 2015-01-11: 14:00:00 via INTRAMUSCULAR

## 2015-01-11 MED ORDER — METRONIDAZOLE 500 MG TAB
500 mg | ORAL_TABLET | Freq: Two times a day (BID) | ORAL | Status: AC
Start: 2015-01-11 — End: 2015-01-21

## 2015-01-11 MED ORDER — AZITHROMYCIN 1 GRAM ORAL PACKET
1 gram | ORAL | Status: AC
Start: 2015-01-11 — End: 2015-01-11
  Administered 2015-01-11: 14:00:00 via ORAL

## 2015-01-11 MED FILL — CEFTRIAXONE 250 MG SOLUTION FOR INJECTION: 250 mg | INTRAMUSCULAR | Qty: 250

## 2015-01-11 MED FILL — PROMETHAZINE 25 MG/ML INJECTION: 25 mg/mL | INTRAMUSCULAR | Qty: 1

## 2015-01-11 MED FILL — AZITHROMYCIN 1 GRAM ORAL PACKET: 1 gram | ORAL | Qty: 1

## 2015-01-11 NOTE — ED Notes (Signed)
Prescription given to pt for flagyl

## 2015-01-11 NOTE — ED Notes (Signed)
Pt sts 6 weeks preg. Now spotting

## 2015-01-11 NOTE — ED Notes (Signed)
md to room talked with concerning wet prep findings and changing order for zofran to phenergan,condition unchanged boyfriend at bedside

## 2015-01-11 NOTE — ED Notes (Signed)
I have reviewed discharge instructions with the patient.  The patient verbalized understanding.work note given : may return to work on 01/16/2015

## 2015-01-11 NOTE — ED Provider Notes (Signed)
HPI Comments: Patient is G3 P2.  She is about [redacted] weeks pregnant.  Last menstrual period January 20.  With her prior pregnancies had hyperemesis gravidarum.  Has been seen in the ER twice on the first and the third.  Both her nausea and vomiting in pregnancy.  Clinically not dehydrated so discharged with nausea medication.  She followed up on the second with OB Dr. Thurmond Butts.  Back again today with vaginal spotting starting this morning.  Also some discharge and lower abdominal cramping.  Nausea and vomiting and continued throughout pregnancy.    Patient is a 24 y.o. female presenting with abdominal pain. The history is provided by the patient. No language interpreter was used.   Abdominal Pain   This is a new problem. The current episode started 3 to 5 hours ago. The problem occurs constantly. The problem has not changed since onset.The pain is associated with an unknown factor. The pain is located in the suprapubic region. The quality of the pain is cramping. The pain is mild. Associated symptoms include nausea and vomiting. Pertinent negatives include no fever, no diarrhea, no melena, no constipation, no dysuria, no hematuria, no headaches, no chest pain and no back pain. Nothing worsens the pain. The pain is relieved by nothing.        Past Medical History:   Diagnosis Date   ??? Chlamydia      treated in feb 2014   ??? PIH (pregnancy induced hypertension)      with both pregnancies   ??? Gonorrhea      tx october 2014       Past Surgical History:   Procedure Laterality Date   ??? Hx tonsillectomy     ??? Hx other surgical           Family History:   Problem Relation Age of Onset   ??? Hypertension Maternal Grandmother        History     Social History   ??? Marital Status: SINGLE     Spouse Name: N/A   ??? Number of Children: N/A   ??? Years of Education: N/A     Occupational History   ??? Not on file.     Social History Main Topics   ??? Smoking status: Current Every Day Smoker -- 0.50 packs/day   ??? Smokeless tobacco: Never Used    ??? Alcohol Use: No   ??? Drug Use: Yes      Comment: THC   ??? Sexual Activity:     Partners: Male     Patent examiner Protection: None     Other Topics Concern   ??? Not on file     Social History Narrative           ALLERGIES: Review of patient's allergies indicates no known allergies.      Review of Systems   Constitutional: Negative for fever and chills.   HENT: Negative for rhinorrhea and sore throat.    Eyes: Negative for pain and redness.   Respiratory: Negative for chest tightness, shortness of breath and wheezing.    Cardiovascular: Negative for chest pain and leg swelling.   Gastrointestinal: Positive for nausea, vomiting and abdominal pain. Negative for diarrhea, constipation and melena.   Genitourinary: Positive for vaginal bleeding and vaginal discharge. Negative for dysuria and hematuria.   Musculoskeletal: Negative for back pain, gait problem, neck pain and neck stiffness.   Skin: Negative for color change and rash.   Neurological: Negative for weakness, numbness and  headaches.       Filed Vitals:    01/11/15 0710   BP: 132/60   Pulse: 86   Temp: 98.3 ??F (36.8 ??C)   Resp: 20   Height: $Remove'5\' 9"'Jaeetbc$  (1.753 m)   Weight: 90.719 kg (200 lb)   SpO2: 98%            Physical Exam   Constitutional: She is oriented to person, place, and time. She appears well-developed and well-nourished. No distress.   HENT:   Head: Normocephalic and atraumatic.   Neck: Normal range of motion. Neck supple.   Cardiovascular: Normal rate and regular rhythm.    No murmur heard.  Pulmonary/Chest: Effort normal and breath sounds normal. She has no wheezes.   Abdominal: Soft. Bowel sounds are normal. There is tenderness (suprapubic). There is no rebound and no guarding.   Genitourinary: Cervix exhibits discharge and friability. Cervix exhibits no motion tenderness. Right adnexum displays no mass and no tenderness. Left adnexum displays no mass and no tenderness. There is bleeding (very  small) in the vagina. No foreign body around the vagina. Vaginal discharge found.   Musculoskeletal: Normal range of motion. She exhibits no edema.   Neurological: She is alert and oriented to person, place, and time.   Skin: Skin is warm and dry.   Nursing note and vitals reviewed.       MDM  Number of Diagnoses or Management Options  Diagnosis management comments: Vaginal spotting. Lower abd cramping. Gestational sac seen on Bedside US. Will have pt follow up with OB. Treat with abx for trich and BV.        Amount and/or Complexity of Data Reviewed  Clinical lab tests: ordered and reviewed  Tests in the radiology section of CPT??: reviewed  Tests in the medicine section of CPT??: ordered and reviewed        Bedside US  Date/Time: 01/11/2015 7:55 AM  Consent: Verbal consent obtained.  Consent given by: patient  Procedure Type: Abdominal  Indication: abdominal pain and pregnancy  Images Obtained: Transabdominal  Findings: Gestational sac.  Confirmation Study: I have advised the patient to obtain a confirmatory study as an outpatient.      Results Include:    Recent Results (from the past 24 hour(s))   URINE MICROSCOPIC    Collection Time: 01/11/15  7:15 AM   Result Value Ref Range    WBC 5-10 0 /hpf    RBC 0 0 /hpf    Epithelial cells 5-10 0 /hpf    Bacteria 0 0 /hpf    Casts 0 0 /lpf    Crystals MARKED 0 /LPF    Mucus 0 0 /lpf   WET PREP    Collection Time: 01/11/15  7:40 AM   Result Value Ref Range    Special Requests: VAGINA      Wet prep 10 TO 20 WBC     Wet prep MODERATE  MOTILE TRICHOMONAS NOTED        Wet prep NO YEAST SEEN      Wet prep MODERATE  CLUE CELLS PRESENT       CBC WITH AUTOMATED DIFF    Collection Time: 01/11/15  8:00 AM   Result Value Ref Range    WBC 4.6 4.3 - 11.1 K/uL    RBC 4.78 4.05 - 5.25 M/uL    HGB 14.1 11.7 - 15.4 g/dL    HCT 41.3 35.8 - 46.3 %    MCV 86.4 79.6 - 97.8 FL  MCH 29.5 26.1 - 32.9 PG    MCHC 34.1 31.4 - 35.0 g/dL    RDW 13.1 11.9 - 14.6 %    PLATELET 143 (L) 150 - 450 K/uL     MPV 10.8 10.8 - 14.1 FL    DF AUTOMATED      NEUTROPHILS 71 43 - 78 %    LYMPHOCYTES 18 13 - 44 %    MONOCYTES 11 4.0 - 12.0 %    EOSINOPHILS 0 (L) 0.5 - 7.8 %    BASOPHILS 0 0.0 - 2.0 %    IMMATURE GRANULOCYTES 0.2 0.0 - 5.0 %    ABS. NEUTROPHILS 3.2 1.7 - 8.2 K/UL    ABS. LYMPHOCYTES 0.8 0.5 - 4.6 K/UL    ABS. MONOCYTES 0.5 0.1 - 1.3 K/UL    ABS. EOSINOPHILS 0.0 0.0 - 0.8 K/UL    ABS. BASOPHILS 0.0 0.0 - 0.2 K/UL    ABS. IMM. GRANS. 0.0 0.0 - 0.5 K/UL   METABOLIC PANEL, COMPREHENSIVE    Collection Time: 01/11/15  8:00 AM   Result Value Ref Range    Sodium 144 136 - 145 mmol/L    Potassium 3.1 (L) 3.5 - 5.1 mmol/L    Chloride 107 98 - 107 mmol/L    CO2 27 21 - 32 mmol/L    Anion gap 10 7 - 16 mmol/L    Glucose 87 65 - 100 mg/dL    BUN 6 6 - 23 MG/DL    Creatinine 0.69 0.6 - 1.0 MG/DL    GFR est AA >60 >60 ml/min/1.55m    GFR est non-AA >60 >60 ml/min/1.781m   Calcium 8.4 8.3 - 10.4 MG/DL    Bilirubin, total 0.2 0.2 - 1.1 MG/DL    ALT 27 12 - 65 U/L    AST 31 15 - 37 U/L    Alk. phosphatase 53 50 - 136 U/L    Protein, total 6.9 6.3 - 8.2 g/dL    Albumin 3.2 (L) 3.5 - 5.0 g/dL    Globulin 3.7 (H) 2.3 - 3.5 g/dL    A-G Ratio 0.9 (L) 1.2 - 3.5     LIPASE    Collection Time: 01/11/15  8:00 AM   Result Value Ref Range    Lipase 51 (L) 73 - 393 U/L   TOTAL HCG, QT.    Collection Time: 01/11/15  8:00 AM   Result Value Ref Range    HCG, Qt. 22267 (H) 0.0 - 6.0 MIU/ML   TYPE, ABO & RH    Collection Time: 01/11/15  8:00 AM   Result Value Ref Range    ABO/Rh(D) O POSITIVE

## 2015-01-11 NOTE — ED Notes (Signed)
Pelvic done by md pt tolerated procedure well boyfriend at bedside

## 2015-01-11 NOTE — ED Notes (Signed)
Pt sts had small amt of spotting this am when she went to bathroom this am. sts was seen here couple of time; last time was 01/09/15 for vomiting; pt sts they did not do a preg test or ultrasound.

## 2015-01-11 NOTE — ED Notes (Signed)
Spotting started this am

## 2015-01-11 NOTE — ED Notes (Signed)
md to room

## 2015-01-14 LAB — CHLAMYDIA / GC-AMPLIFIED
Chlamydia trachomatis, NAA: NEGATIVE
Neisseria gonorrhoeae, NAA: NEGATIVE

## 2020-06-18 ENCOUNTER — Encounter: Payer: Self-pay | Admitting: Emergency Medicine

## 2020-06-18 ENCOUNTER — Emergency Department
Admission: EM | Admit: 2020-06-18 | Discharge: 2020-06-18 | Disposition: A | Discharge status: Home / Self Care | Payer: 59 | Attending: Emergency Medicine | Admitting: Emergency Medicine

## 2020-06-18 ENCOUNTER — Other Ambulatory Visit: Payer: Self-pay

## 2020-06-18 DIAGNOSIS — O219 Vomiting of pregnancy, unspecified: Secondary | ICD-10-CM | POA: Insufficient documentation

## 2020-06-18 DIAGNOSIS — Z5321 Procedure and treatment not carried out due to patient leaving prior to being seen by health care provider: Secondary | ICD-10-CM | POA: Insufficient documentation

## 2020-06-18 DIAGNOSIS — Z3A00 Weeks of gestation of pregnancy not specified: Secondary | ICD-10-CM | POA: Diagnosis not present

## 2020-06-18 LAB — CBC
HCT: 41.8 % (ref 36.0–46.0)
Hemoglobin: 14.8 g/dL (ref 12.0–15.0)
MCH: 30 pg (ref 26.0–34.0)
MCHC: 35.4 g/dL (ref 30.0–36.0)
MCV: 84.6 fL (ref 80.0–100.0)
Platelets: 170 10*3/uL (ref 150–400)
RBC: 4.94 MIL/uL (ref 3.87–5.11)
RDW: 12.9 % (ref 11.5–15.5)
WBC: 9.1 10*3/uL (ref 4.0–10.5)
nRBC: 0 % (ref 0.0–0.2)

## 2020-06-18 LAB — LIPASE, BLOOD: Lipase: 21 U/L (ref 11–51)

## 2020-06-18 LAB — COMPREHENSIVE METABOLIC PANEL
ALT: 16 U/L (ref 0–44)
AST: 18 U/L (ref 15–41)
Albumin: 4.3 g/dL (ref 3.5–5.0)
Alkaline Phosphatase: 41 U/L (ref 38–126)
Anion gap: 11 (ref 5–15)
BUN: 9 mg/dL (ref 6–20)
CO2: 21 mmol/L — ABNORMAL LOW (ref 22–32)
Calcium: 9.4 mg/dL (ref 8.9–10.3)
Chloride: 104 mmol/L (ref 98–111)
Creatinine, Ser: 0.7 mg/dL (ref 0.44–1.00)
GFR calc Af Amer: >60 mL/min (ref >60)
GFR calc non Af Amer: >60 mL/min (ref >60)
Glucose, Bld: 82 mg/dL (ref 70–99)
Potassium: 4 mmol/L (ref 3.5–5.1)
Sodium: 136 mmol/L (ref 135–145)
Total Bilirubin: 0.8 mg/dL (ref 0.3–1.2)
Total Protein: 7.6 g/dL (ref 6.5–8.1)

## 2020-06-18 NOTE — ED Triage Notes (Signed)
LMP:  05/07/2020.  Positive home pregnancy test.  G2 P1. C/O vomiting.  STates with first pregnancy, had hyperemesis.

## 2020-06-18 NOTE — ED Notes (Signed)
No answer when called 

## 2020-06-18 NOTE — ED Notes (Signed)
Called no answer

## 2020-06-18 NOTE — ED Notes (Signed)
Cannot find pt for reassessment. 

## 2020-06-19 ENCOUNTER — Telehealth: Payer: Self-pay | Admitting: *Deleted

## 2020-06-19 NOTE — Telephone Encounter (Signed)
Sherri Lloyd presented to the ED and left before being seen by the provider on 06/18/20 The patient has been enrolled in an automated general discharge outreach program and 2 attempts to contact the patient will be made to follow up on their ED visit and subsequent needs. The care management team is available to provide assistance to this patient at any time.   Burnard Bunting, RN, BSN, CCRN [facility] [phone]

## 2020-11-13 ENCOUNTER — Other Ambulatory Visit: Payer: Self-pay

## 2020-11-13 ENCOUNTER — Inpatient Hospital Stay
Admission: EM | Admit: 2020-11-13 | Discharge: 2020-11-15 | DRG: 831 | Disposition: A | Discharge status: Home / Self Care | Payer: Medicaid Other | Attending: Obstetrics and Gynecology | Admitting: Obstetrics and Gynecology

## 2020-11-13 ENCOUNTER — Observation Stay: Payer: Medicaid Other

## 2020-11-13 DIAGNOSIS — N132 Hydronephrosis with renal and ureteral calculous obstruction: Secondary | ICD-10-CM | POA: Diagnosis not present

## 2020-11-13 DIAGNOSIS — Z3A28 28 weeks gestation of pregnancy: Secondary | ICD-10-CM

## 2020-11-13 DIAGNOSIS — O99891 Other specified diseases and conditions complicating pregnancy: Secondary | ICD-10-CM | POA: Diagnosis not present

## 2020-11-13 DIAGNOSIS — O26893 Other specified pregnancy related conditions, third trimester: Secondary | ICD-10-CM | POA: Diagnosis present

## 2020-11-13 DIAGNOSIS — R109 Unspecified abdominal pain: Secondary | ICD-10-CM

## 2020-11-13 DIAGNOSIS — R1031 Right lower quadrant pain: Secondary | ICD-10-CM | POA: Diagnosis not present

## 2020-11-13 DIAGNOSIS — O98513 Other viral diseases complicating pregnancy, third trimester: Secondary | ICD-10-CM | POA: Diagnosis present

## 2020-11-13 DIAGNOSIS — U071 COVID-19: Secondary | ICD-10-CM | POA: Diagnosis present

## 2020-11-13 DIAGNOSIS — O26833 Pregnancy related renal disease, third trimester: Secondary | ICD-10-CM | POA: Diagnosis present

## 2020-11-13 DIAGNOSIS — N202 Calculus of kidney with calculus of ureter: Secondary | ICD-10-CM | POA: Diagnosis present

## 2020-11-13 DIAGNOSIS — Z87891 Personal history of nicotine dependence: Secondary | ICD-10-CM | POA: Diagnosis not present

## 2020-11-13 DIAGNOSIS — N200 Calculus of kidney: Secondary | ICD-10-CM | POA: Diagnosis not present

## 2020-11-13 HISTORY — DX: Other specified health status: Z78.9

## 2020-11-13 LAB — RESP PANEL BY RT-PCR (FLU A&B, COVID) ARPGX2
Influenza A by PCR: NEGATIVE
Influenza B by PCR: NEGATIVE
SARS Coronavirus 2 by RT PCR: POSITIVE — AB

## 2020-11-13 LAB — URINALYSIS, COMPLETE (UACMP) WITH MICROSCOPIC
Bilirubin Urine: NEGATIVE
Glucose, UA: NEGATIVE mg/dL
Ketones, ur: 80 mg/dL — AB
Leukocytes,Ua: NEGATIVE
Nitrite: NEGATIVE
Protein, ur: 30 mg/dL — AB
RBC / HPF: >50 RBC/hpf — ABNORMAL HIGH (ref 0–5)
Specific Gravity, Urine: 1.025 (ref 1.005–1.030)
pH: 5 (ref 5.0–8.0)

## 2020-11-13 LAB — CBC WITH DIFFERENTIAL/PLATELET
Abs Immature Granulocytes: 0.04 10*3/uL (ref 0.00–0.07)
Basophils Absolute: 0 10*3/uL (ref 0.0–0.1)
Basophils Relative: 0 %
Eosinophils Absolute: 0 10*3/uL (ref 0.0–0.5)
Eosinophils Relative: 0 %
HCT: 34 % — ABNORMAL LOW (ref 36.0–46.0)
Hemoglobin: 11.7 g/dL — ABNORMAL LOW (ref 12.0–15.0)
Immature Granulocytes: 1 %
Lymphocytes Relative: 2 %
Lymphs Abs: 0.2 10*3/uL — ABNORMAL LOW (ref 0.7–4.0)
MCH: 30.8 pg (ref 26.0–34.0)
MCHC: 34.4 g/dL (ref 30.0–36.0)
MCV: 89.5 fL (ref 80.0–100.0)
Monocytes Absolute: 0.9 10*3/uL (ref 0.1–1.0)
Monocytes Relative: 13 %
Neutro Abs: 5.7 10*3/uL (ref 1.7–7.7)
Neutrophils Relative %: 84 %
Platelets: 179 10*3/uL (ref 150–400)
RBC: 3.8 MIL/uL — ABNORMAL LOW (ref 3.87–5.11)
RDW: 12.8 % (ref 11.5–15.5)
WBC: 6.9 10*3/uL (ref 4.0–10.5)
nRBC: 0 % (ref 0.0–0.2)

## 2020-11-13 LAB — COMPREHENSIVE METABOLIC PANEL
ALT: 14 U/L (ref 0–44)
AST: 18 U/L (ref 15–41)
Albumin: 2.8 g/dL — ABNORMAL LOW (ref 3.5–5.0)
Alkaline Phosphatase: 53 U/L (ref 38–126)
Anion gap: 10 (ref 5–15)
BUN: 8 mg/dL (ref 6–20)
CO2: 21 mmol/L — ABNORMAL LOW (ref 22–32)
Calcium: 8.5 mg/dL — ABNORMAL LOW (ref 8.9–10.3)
Chloride: 104 mmol/L (ref 98–111)
Creatinine, Ser: 0.6 mg/dL (ref 0.44–1.00)
GFR, Estimated: >60 mL/min (ref >60)
Glucose, Bld: 85 mg/dL (ref 70–99)
Potassium: 3.5 mmol/L (ref 3.5–5.1)
Sodium: 135 mmol/L (ref 135–145)
Total Bilirubin: 0.7 mg/dL (ref 0.3–1.2)
Total Protein: 6.1 g/dL — ABNORMAL LOW (ref 6.5–8.1)

## 2020-11-13 LAB — URINE DRUG SCREEN, QUALITATIVE (ARMC ONLY)
Amphetamines, Ur Screen: NOT DETECTED
Barbiturates, Ur Screen: NOT DETECTED
Benzodiazepine, Ur Scrn: NOT DETECTED
Cannabinoid 50 Ng, Ur ~~LOC~~: POSITIVE — AB
Cocaine Metabolite,Ur ~~LOC~~: NOT DETECTED
MDMA (Ecstasy)Ur Screen: NOT DETECTED
Methadone Scn, Ur: NOT DETECTED
Opiate, Ur Screen: NOT DETECTED
Phencyclidine (PCP) Ur S: NOT DETECTED
Tricyclic, Ur Screen: NOT DETECTED

## 2020-11-13 MED ORDER — LACTATED RINGERS IV SOLN: INTRAVENOUS | Status: DC

## 2020-11-13 MED ORDER — DIPHENHYDRAMINE HCL 12.5 MG/5ML PO ELIX
12.5000 mg | ORAL_SOLUTION | Freq: Four times a day (QID) | ORAL | Status: DC | PRN
  Filled 2020-11-13: qty 5

## 2020-11-13 MED ORDER — SODIUM CHLORIDE 0.9 % IV SOLN
INTRAVENOUS | Status: DC
  Administered 2020-11-14 (×2): 1000 mL via INTRAVENOUS

## 2020-11-13 MED ORDER — BUTORPHANOL TARTRATE 1 MG/ML IJ SOLN: 0.5000 mg | Freq: Once | INTRAMUSCULAR | Status: AC

## 2020-11-13 MED ORDER — DIPHENHYDRAMINE HCL 50 MG/ML IJ SOLN: 12.5000 mg | Freq: Four times a day (QID) | INTRAMUSCULAR | Status: DC | PRN

## 2020-11-13 MED ORDER — SODIUM CHLORIDE 0.9% FLUSH: 9.0000 mL | INTRAVENOUS | Status: DC | PRN

## 2020-11-13 MED ORDER — ACETAMINOPHEN 325 MG PO TABS
650.0000 mg | ORAL_TABLET | ORAL | Status: DC | PRN
  Administered 2020-11-13 – 2020-11-15 (×2): 650 mg via ORAL
  Filled 2020-11-13 (×2): qty 2

## 2020-11-13 MED ORDER — CALCIUM CARBONATE ANTACID 500 MG PO CHEW
400.0000 mg | CHEWABLE_TABLET | Freq: Three times a day (TID) | ORAL | Status: DC | PRN
  Administered 2020-11-13 – 2020-11-14 (×2): 400 mg via ORAL
  Filled 2020-11-13 (×2): qty 2

## 2020-11-13 MED ORDER — BUTORPHANOL TARTRATE 1 MG/ML IJ SOLN
INTRAMUSCULAR | Status: AC
  Administered 2020-11-13: 0.5 mg via INTRAVENOUS
  Filled 2020-11-13: qty 1

## 2020-11-13 MED ORDER — NALOXONE HCL 0.4 MG/ML IJ SOLN: 0.4000 mg | INTRAMUSCULAR | Status: DC | PRN

## 2020-11-13 MED ORDER — HYDROMORPHONE 1 MG/ML IV SOLN
INTRAVENOUS | Status: DC
  Administered 2020-11-13: 25 mg via INTRAVENOUS
  Administered 2020-11-14: 1 mg via INTRAVENOUS
  Administered 2020-11-14: 1 mL via INTRAVENOUS
  Administered 2020-11-14: 0.2 mL via INTRAVENOUS
  Filled 2020-11-13: qty 30
  Filled 2020-11-13: qty 25
  Filled 2020-11-13 (×2): qty 30

## 2020-11-13 MED ORDER — PRENATAL MULTIVITAMIN CH
1.0000 | ORAL_TABLET | Freq: Every day | ORAL | Status: DC
  Administered 2020-11-14: 1 via ORAL
  Filled 2020-11-13: qty 1

## 2020-11-13 MED ORDER — ONDANSETRON HCL 4 MG/2ML IJ SOLN
4.0000 mg | Freq: Four times a day (QID) | INTRAMUSCULAR | Status: DC | PRN
  Administered 2020-11-13 – 2020-11-14 (×3): 4 mg via INTRAVENOUS
  Filled 2020-11-13 (×3): qty 2
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
## 2020-11-13 NOTE — H&P (Signed)
Obstetric H&P   Chief Complaint: Flank pain  Prenatal Care Provider: [facility]  History of Present Illness: 30 y.o. [id number] [redacted]w[redacted]d by 02/01/2021, by Last Menstrual Period presenting to L&D with acute onset right flank pain.  UA on presentation was notable for microscopic hematuria but otherwise normal.  She denies fevers or chills.  Does report associated nausea.  Pain is sharp comes and goes in waves.  She underwent renal ultrasound showing a right ureteral stone 59mm.  No history of kidney stones although she does report a family history of nephrolithiasis in her mom.  Laboratory work up otherwise negative, no elevated WBC and normal BUN/Cr.  +FM, no LOF, no VB, no contractions   Pregravid weight Pregravid weight not on file Total Weight Gain Not found.   Review of Systems: 10 point review of systems negative unless otherwise noted in HPI  Past Medical History: Patient Active Problem List   Diagnosis Date Noted  . Abdominal pain in pregnancy, third trimester 11/13/2020    Past Surgical History: Past Surgical History:  Procedure Laterality Date  . TONSILLECTOMY      Past Obstetric History: # 1 - Date: 06/29/08, Sex: Female, Weight: 2977 g, GA: None, Delivery: Vaginal, Spontaneous, Apgar1: None, Apgar5: None, Living: Living, Birth Comments: None  # 2 - Date: 03/20/13, Sex: Female, Weight: 3629 g, GA: None, Delivery: None, Apgar1: None, Apgar5: None, Living: Living, Birth Comments: None  # 3 - Date: 08/31/15, Sex: Female, Weight: 3600 g, GA: None, Delivery: None, Apgar1: None, Apgar5: None, Living: Living, Birth Comments: None  # 4 - Date: None, Sex: None, Weight: None, GA: None, Delivery: None, Apgar1: None, Apgar5: None, Living: None, Birth Comments: None   Past Gynecologic History:  Family History: History reviewed. No pertinent family history.  Social History: Social History   Socioeconomic History  . Marital status: Single    Spouse name: Not on file  . Number of  children: Not on file  . Years of education: Not on file  . Highest education level: Not on file  Occupational History  . Not on file  Tobacco Use  . Smoking status: Former Games developer  . Smokeless tobacco: Never Used  Vaping Use  . Vaping Use: Not on file  Substance and Sexual Activity  . Alcohol use: Never  . Drug use: Yes    Types: Marijuana  . Sexual activity: Yes    Birth control/protection: None  Other Topics Concern  . Not on file  Social History Narrative  . Not on file   Social Determinants of Health   Financial Resource Strain: Not on file  Food Insecurity: Not on file  Transportation Needs: Not on file  Physical Activity: Not on file  Stress: Not on file  Social Connections: Not on file  Intimate Partner Violence: Not on file    Medications: Prior to Admission medications   Medication Sig Start Date End Date Taking? Authorizing Provider  Prenatal Vit-Fe Fumarate-FA (PRENATAL MULTIVITAMIN) TABS tablet Take 1 tablet by mouth daily at 12 noon.   Yes [provider]    Allergies: No Known Allergies  Physical Exam: Vitals: Blood pressure (!) 102/50, pulse (!) 108, temperature 98.3 F (36.8 C), temperature source Oral, resp. rate 20, height 5\' 10"  (1.778 m), weight 94.3 kg, last menstrual period 04/27/2020, SpO2 97 %.  FHT: 160, moderate, +accels, no decels Toco: none  General: NAD HEENT: normocephalic, anicteric Pulmonary: No increased work of breathing Cardiovascular: RRR, distal pulses 2+ Abdomen: Gravid, non-tender some right  flank tenderness no CVA Genitourinary: deferred Extremities: no edema, erythema, or tenderness Neurologic: Grossly intact Psychiatric: mood appropriate, affect full  Labs: Results for orders placed or performed during the hospital encounter of 11/13/20 (from the past 24 hour(s))  Urine Drug Screen, Qualitative ([facility] only)     Status: Abnormal   Collection Time: 11/13/20 11:16 AM  Result Value Ref Range   Tricyclic, Ur  Screen NONE DETECTED NONE DETECTED   Amphetamines, Ur Screen NONE DETECTED NONE DETECTED   MDMA (Ecstasy)Ur Screen NONE DETECTED NONE DETECTED   Cocaine Metabolite,Ur [place] NONE DETECTED NONE DETECTED   Opiate, Ur Screen NONE DETECTED NONE DETECTED   Phencyclidine (PCP) Ur S NONE DETECTED NONE DETECTED   Cannabinoid 50 Ng, Ur [place] POSITIVE (A) NONE DETECTED   Barbiturates, Ur Screen NONE DETECTED NONE DETECTED   Benzodiazepine, Ur Scrn NONE DETECTED NONE DETECTED   Methadone Scn, Ur NONE DETECTED NONE DETECTED  Urinalysis, Complete w Microscopic Urine, Random     Status: Abnormal   Collection Time: 11/13/20 11:16 AM  Result Value Ref Range   Color, Urine YELLOW (A) YELLOW   APPearance CLOUDY (A) CLEAR   Specific Gravity, Urine 1.025 1.005 - 1.030   pH 5.0 5.0 - 8.0   Glucose, UA NEGATIVE NEGATIVE mg/dL   Hgb urine dipstick LARGE (A) NEGATIVE   Bilirubin Urine NEGATIVE NEGATIVE   Ketones, ur 80 (A) NEGATIVE mg/dL   Protein, ur 30 (A) NEGATIVE mg/dL   Nitrite NEGATIVE NEGATIVE   Leukocytes,Ua NEGATIVE NEGATIVE   RBC / HPF >50 (H) 0 - 5 RBC/hpf   WBC, UA 6-10 0 - 5 WBC/hpf   Bacteria, UA RARE (A) NONE SEEN   Squamous Epithelial / LPF 0-5 0 - 5   Mucus PRESENT   CBC with Differential/Platelet     Status: Abnormal   Collection Time: 11/13/20  1:03 PM  Result Value Ref Range   WBC 6.9 4.0 - 10.5 K/uL   RBC 3.80 (L) 3.87 - 5.11 MIL/uL   Hemoglobin 11.7 (L) 12.0 - 15.0 g/dL   HCT 69.6 (L) 29.5 - 28.4 %   MCV 89.5 80.0 - 100.0 fL   MCH 30.8 26.0 - 34.0 pg   MCHC 34.4 30.0 - 36.0 g/dL   RDW 13.2 44.0 - 10.2 %   Platelets 179 150 - 400 K/uL   nRBC 0.0 0.0 - 0.2 %   Neutrophils Relative % 84 %   Neutro Abs 5.7 1.7 - 7.7 K/uL   Lymphocytes Relative 2 %   Lymphs Abs 0.2 (L) 0.7 - 4.0 K/uL   Monocytes Relative 13 %   Monocytes Absolute 0.9 0.1 - 1.0 K/uL   Eosinophils Relative 0 %   Eosinophils Absolute 0.0 0.0 - 0.5 K/uL   Basophils Relative 0 %   Basophils Absolute 0.0 0.0 - 0.1 K/uL    Immature Granulocytes 1 %   Abs Immature Granulocytes 0.04 0.00 - 0.07 K/uL  Comprehensive metabolic panel     Status: Abnormal   Collection Time: 11/13/20  1:03 PM  Result Value Ref Range   Sodium 135 135 - 145 mmol/L   Potassium 3.5 3.5 - 5.1 mmol/L   Chloride 104 98 - 111 mmol/L   CO2 21 (L) 22 - 32 mmol/L   Glucose, Bld 85 70 - 99 mg/dL   BUN 8 6 - 20 mg/dL   Creatinine, Ser 7.25 0.44 - 1.00 mg/dL   Calcium 8.5 (L) 8.9 - 10.3 mg/dL   Total Protein 6.1 (L) 6.5 -  8.1 g/dL   Albumin 2.8 (L) 3.5 - 5.0 g/dL   AST 18 15 - 41 U/L   ALT 14 0 - 44 U/L   Alkaline Phosphatase 53 38 - 126 U/L   Total Bilirubin 0.7 0.3 - 1.2 mg/dL   GFR, Estimated >60 >60 mL/min   Anion gap 10 5 - 15   US RENAL  Result Date: 11/13/2020 CLINICAL DATA:  Acute flank pain.  Twenty-eight weeks pregnant. EXAM: RENAL / URINARY TRACT ULTRASOUND COMPLETE COMPARISON:  None. FINDINGS: Right Kidney: Renal measurements: 10.8 x 5.7 x 7.2 cm = volume: 228 mL. Echogenicity within normal limits. 6 mm nonshadowing echogenic focus in the proximal ureter with mild hydronephrosis. No mass visualized. Left Kidney: Renal measurements: 11.4 x 5.7 x 5.1 cm = volume: 172 mL. Echogenicity within normal limits. No mass or hydronephrosis visualized. Bladder: Appears normal for degree of bladder distention. The right ureteral jet is not visualized. Other: None. IMPRESSION: 1. Mild right hydronephrosis with possible 6 mm calculus in the proximal ureter. Electronically Signed   By: Titus Dubin M.D.   On: 11/13/2020 13:54     Assessment: 30 y.o. [id number] [redacted]w[redacted]d by 02/01/2021, by Last Menstrual Period 73mm proximal ureteral stone  Plan: 1) Ureteral stone  - will admit for IV fluid and pain control/antiemetics - strain urine - If failure to improve symptoms in the next 24-hrs repeat ultrasound to see if progression in stone location and urology consult to discuss possible need for stent/nephrostomy  2) Fetus - cat I tracing  3)  Disposition - pending improvement in symptoms  Malachy Mood, MD, [place], [facility] 11/13/2020, 2:40 PM

## 2020-11-13 NOTE — Consult Note (Signed)
     Urology Consult   I have been asked to see the patient by Dr. Georgianne Fick, for evaluation and management of right hydronephrosis.  Chief Complaint: Right-sided flank pain  HPI:  Sherri Lloyd is a 30 y.o. year old female at [redacted] weeks gestation who presents with 1 day of right-sided flank pain that radiates from the right flank down to the right groin.  She denies any fevers or chills.  She denies any prior history of kidney stones.  Her pain is currently very well controlled and she has no complaints.  No aggravating factors.  Severity is mild to moderate.  On admission, labs notable for WBC 6.9, creatinine 0.6(EGFR greater than 60), urinalysis without evidence of infection with greater than 50 RBCs, 6-10 WBCs, rare bacteria, no leukocytes, nitrite negative.  Covid positive.  PMH: Past Medical History:  Diagnosis Date  . Medical history non-contributory     Surgical History: Past Surgical History:  Procedure Laterality Date  . TONSILLECTOMY      Allergies: No Known Allergies  Family History: History reviewed. No pertinent family history.  Social History:  reports that she has been smoking. She has never used smokeless tobacco. She reports previous alcohol use. She reports current drug use. Drug: Marijuana.  ROS: Negative aside from those stated in the HPI.  Physical Exam: BP (!) 100/56 (BP Location: Right Arm)   Pulse 98   Temp 99.1 F (37.3 C) (Axillary)   Resp 20   Ht _0  (1.778 m)   Wt 94.3 kg   LMP 04/27/2020   SpO2 97%   BMI 29.84 kg/m    Constitutional:  Alert and oriented, No acute distress. Cardiovascular: No clubbing, cyanosis, or edema. Respiratory: Normal respiratory effort, no increased work of breathing. GI: Abdomen is soft, nontender, nondistended, no abdominal masses GU: Mild right CVA tenderness  Laboratory Data: Reviewed, see HPI  Pertinent Imaging: I have personally reviewed the renal ultrasound showing mild right-sided  hydronephrosis and possible 6 mm proximal ureteral stone measured by ultrasound.  Assessment & Plan:   30 year old female who is Covid positive and at [redacted] weeks gestation with 1 day of right-sided flank pain likely secondary to a 6 mm right proximal ureteral stone.  She has no clinical or laboratory signs of infection and her pain is currently very well controlled.  We discussed the tendency of renal ultrasound to overestimate stone size, and high likelihood of spontaneous passage.  I recommended pain control and trial of passage.  We discussed indications for intervention with ureteral stent placement, ureteroscopy and laser lithotripsy, or nephrostomy tube including infection or uncontrolled pain.  We discussed the risks of anesthesia and surgeries during pregnancy.  Recommendations: Pain control per OB service, okay for discharge from urology perspective Anticipate spontaneous passage, as ultrasound tends to overestimate stone size Urology available for re- discussion of intervention if uncontrolled pain or evidence of infection  Billey Co, MD  [facility] [address], [place], [place] [address] [phone]

## 2020-11-13 NOTE — Progress Notes (Signed)
Pt transported downstairs to [place] via wheelchair.

## 2020-11-14 DIAGNOSIS — U071 COVID-19: Secondary | ICD-10-CM

## 2020-11-14 DIAGNOSIS — Z3A28 28 weeks gestation of pregnancy: Secondary | ICD-10-CM

## 2020-11-14 DIAGNOSIS — O98513 Other viral diseases complicating pregnancy, third trimester: Secondary | ICD-10-CM

## 2020-11-14 DIAGNOSIS — R1031 Right lower quadrant pain: Secondary | ICD-10-CM

## 2020-11-14 DIAGNOSIS — N200 Calculus of kidney: Secondary | ICD-10-CM

## 2020-11-14 DIAGNOSIS — N132 Hydronephrosis with renal and ureteral calculous obstruction: Secondary | ICD-10-CM

## 2020-11-14 DIAGNOSIS — O26833 Pregnancy related renal disease, third trimester: Secondary | ICD-10-CM

## 2020-11-14 LAB — BASIC METABOLIC PANEL
Anion gap: 11 (ref 5–15)
BUN: 9 mg/dL (ref 6–20)
CO2: 20 mmol/L — ABNORMAL LOW (ref 22–32)
Calcium: 8.6 mg/dL — ABNORMAL LOW (ref 8.9–10.3)
Chloride: 107 mmol/L (ref 98–111)
Creatinine, Ser: 0.6 mg/dL (ref 0.44–1.00)
GFR, Estimated: >60 mL/min (ref >60)
Glucose, Bld: 74 mg/dL (ref 70–99)
Potassium: 3.8 mmol/L (ref 3.5–5.1)
Sodium: 138 mmol/L (ref 135–145)

## 2020-11-14 LAB — CBC
HCT: 35 % — ABNORMAL LOW (ref 36.0–46.0)
Hemoglobin: 11.7 g/dL — ABNORMAL LOW (ref 12.0–15.0)
MCH: 30.3 pg (ref 26.0–34.0)
MCHC: 33.4 g/dL (ref 30.0–36.0)
MCV: 90.7 fL (ref 80.0–100.0)
Platelets: 171 10*3/uL (ref 150–400)
RBC: 3.86 MIL/uL — ABNORMAL LOW (ref 3.87–5.11)
RDW: 13.2 % (ref 11.5–15.5)
WBC: 5.8 10*3/uL (ref 4.0–10.5)
nRBC: 0 % (ref 0.0–0.2)

## 2020-11-14 MED ORDER — PROMETHAZINE HCL 25 MG/ML IJ SOLN
25.0000 mg | INTRAMUSCULAR | Status: DC | PRN
  Administered 2020-11-14: 25 mg via INTRAVENOUS
  Filled 2020-11-14: qty 1

## 2020-11-14 MED ORDER — HYDROMORPHONE HCL 1 MG/ML IJ SOLN: 0.5000 mg | INTRAMUSCULAR | Status: DC | PRN
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
## 2020-11-14 NOTE — Progress Notes (Signed)
Urology Inpatient Progress Note  Subjective: No acute events overnight. Morning labs not yet available. Today she reports improvement of her right flank pain.  She continues to have mild right low back pain.  She has not passed a stone.  She denies a history of nephrolithiasis.  Anti-infectives: Anti-infectives (From admission, onward)   None      Current Facility-Administered Medications  Medication Dose Route Frequency Provider Last Rate Last Admin  . 0.9 %  sodium chloride infusion   Intravenous Continuous Vena Austria, MD 150 mL/hr at 11/14/20 0753 Infusion Verify at 11/14/20 0753  . acetaminophen (TYLENOL) tablet 650 mg  650 mg Oral Q4H PRN Vena Austria, MD   650 mg at 11/13/20 1554  . calcium carbonate (TUMS - dosed in mg elemental calcium) chewable tablet 400 mg of elemental calcium  400 mg of elemental calcium Oral TID PRN Vena Austria, MD   400 mg of elemental calcium at 11/13/20 2045  . diphenhydrAMINE (BENADRYL) injection 12.5 mg  12.5 mg Intravenous Q6H PRN Vena Austria, MD       Or  . diphenhydrAMINE (BENADRYL) 12.5 MG/5ML elixir 12.5 mg  12.5 mg Oral Q6H PRN Vena Austria, MD      . HYDROmorphone (DILAUDID) 1 mg/mL PCA injection   Intravenous Q4H Vena Austria, MD   25 mg at 11/13/20 1608  . lactated ringers infusion   Intravenous Continuous Mirna Mires, CNM   Stopped at 11/13/20 1553  . naloxone [facility]) injection 0.4 mg  0.4 mg Intravenous PRN Vena Austria, MD       And  . sodium chloride flush (NS) 0.9 % injection 9 mL  9 mL Intravenous PRN Vena Austria, MD      . ondansetron (ZOFRAN) injection 4 mg  4 mg Intravenous Q6H PRN Vena Austria, MD   4 mg at 11/13/20 1556  . prenatal multivitamin tablet 1 tablet  1 tablet Oral Q1200 Vena Austria, MD       Objective: Vital signs in last 24 hours: Temp:  [98.3 F (36.8 C)-99.3 F (37.4 C)] 98.3 F (36.8 C) (01/07 0749) Pulse Rate:  [77-112] 77 (01/07 0749) Resp:   [16-20] 18 (01/07 0749) BP: (94-113)/(43-63) 108/61 (01/07 0749) SpO2:  [96 %-100 %] 98 % (01/07 0749) Weight:  [94.3 kg] 94.3 kg (01/06 1104)  Intake/Output from previous day: 01/06 0701 - 01/07 0700 In: 1899.5 [I.V.:1899.5] Out: 500 [Urine:500] Intake/Output this shift: Total I/O In: 582.9 [I.V.:582.9] Out: 200 [Urine:200]  Physical Exam Vitals and nursing note reviewed.  Constitutional:      General: She is not in acute distress.    Appearance: She is not ill-appearing, toxic-appearing or diaphoretic.  HENT:     Head: Normocephalic and atraumatic.  Pulmonary:     Effort: Pulmonary effort is normal. No respiratory distress.  Skin:    General: Skin is warm and dry.  Neurological:     Mental Status: She is alert and oriented to person, place, and time.  Psychiatric:        Mood and Affect: Mood normal.        Behavior: Behavior normal.    Lab Results:  Recent Labs    11/13/20 1303  WBC 6.9  HGB 11.7*  HCT 34.0*  PLT 179   BMET Recent Labs    11/13/20 1303  NA 135  K 3.5  CL 104  CO2 21*  GLUCOSE 85  BUN 8  CREATININE 0.60  CALCIUM 8.5*   Assessment & Plan: 30 y.o. Covid  positive pregnant female at [redacted]w[redacted]d by LMP admitted with right renal colic secondary to a 6 mm right ureteral stone on ultrasound without evidence of infection.  She has elected for trial of passage.  Pain improved this morning without evidence of stone passage.  Recommend ongoing pain control per OB.  We discussed that stone passage may take several weeks and that there is no need for intervention unless she develops signs of infection or uncontrollable pain.  Okay for discharge from urologic perspective.  Carman Ching, PA-C 11/14/2020

## 2020-11-14 NOTE — Progress Notes (Signed)
NST on patient. Strip reviewed by Daphene Calamity CNM.

## 2020-11-14 NOTE — Progress Notes (Signed)
Daily Antepartum Note  Admission Date: 11/13/2020 Current Date: 11/14/2020 10:53 AM  Sherri Lloyd is a 30 y.o. [id number] @ [redacted]w[redacted]d by LMP, HD#2, admitted for acute right flank pain. Patient diagnosed with ureteral stone and incidentally found to have COVID-19 infection.  Pregnancy complicated by:  Patient Active Problem List   Diagnosis Date Noted  . Abdominal pain in pregnancy, third trimester 11/13/2020    Overnight/24hr events:  None  Subjective:  Patient states pain is currently well controlled with PCA pump. Patient reports intermittent lower back pain but denies persistent R flank pain. Patient reports regular, intermittent use of PCA. Patient reports decreased appetite. Denies further S&S of COVID-19 infection including SOB, fatigue, fever, or chills. Patient reports +FM.  Objective:   Vitals:   11/14/20 0359 11/14/20 0749  BP: (!) 98/47 108/61  Pulse: 95 77  Resp: 20 18  Temp: 98.9 F (37.2 C) 98.3 F (36.8 C)  SpO2: 98% 98%   Temp:  [98.3 F (36.8 C)-99.3 F (37.4 C)] 98.3 F (36.8 C) (01/07 0749) Pulse Rate:  [77-112] 77 (01/07 0749) Resp:  [16-20] 18 (01/07 0749) BP: (94-113)/(43-63) 108/61 (01/07 0749) SpO2:  [96 %-100 %] 98 % (01/07 0749) Weight:  [94.3 kg] 94.3 kg (01/06 1104) Temp (24hrs), Avg:98.7 F (37.1 C), Min:98.3 F (36.8 C), Max:99.3 F (37.4 C)   Intake/Output Summary (Last 24 hours) at 11/14/2020 1053 Last data filed at 11/14/2020 0753 Gross per 24 hour  Intake 2482.37 ml  Output 700 ml  Net 1782.37 ml     Current Vital Signs 24h Vital Sign Ranges  T 98.3 F (36.8 C) Temp  Avg: 98.7 F (37.1 C)  Min: 98.3 F (36.8 C)  Max: 99.3 F (37.4 C)  BP 108/61 BP  Min: 94/43  Max: 113/63  HR 77 Pulse  Avg: 94.9  Min: 77  Max: 112  RR 18 Resp  Avg: 18.5  Min: 16  Max: 20  SaO2 98 % Room Air SpO2  Avg: 98.4 %  Min: 96 %  Max: 100 %       24 Hour I/O Current Shift I/O  Time Ins Outs 01/06 0701 - 01/07 0700 In: 1899.5 [I.V.:1899.5] Out: 500  [Urine:500] 01/07 0701 - 01/07 1900 In: 582.9 [I.V.:582.9] Out: 200 [Urine:200]   Patient Vitals for the past 24 hrs:  BP Temp Temp src Pulse Resp SpO2 Height Weight  11/14/20 0749 108/61 98.3 F (36.8 C) Oral 77 18 98 % -- --  11/14/20 0359 (!) 98/47 98.9 F (37.2 C) Oral 95 20 98 % -- --  11/14/20 0359 -- -- -- -- 18 100 % -- --  11/13/20 2315 -- -- -- -- 18 100 % -- --  11/13/20 2314 (!) 109/58 98.4 F (36.9 C) Oral 93 18 100 % -- --  11/13/20 2000 -- -- -- -- 20 99 % -- --  11/13/20 1958 113/63 99.3 F (37.4 C) Oral 80 20 99 % -- --  11/13/20 1811 (!) 100/52 -- -- 88 16 97 % -- --  11/13/20 1711 (!) 100/56 99.1 F (37.3 C) Axillary 98 20 -- -- --  11/13/20 1615 (!) 111/50 -- -- (!) 103 -- -- -- --  11/13/20 1608 -- -- -- -- 18 96 % -- --  11/13/20 1604 (!) 94/43 -- -- (!) 112 16 -- -- --  11/13/20 1245 -- -- -- -- -- 97 % -- --  11/13/20 1104 (!) 102/50 98.3 F (36.8 C) Oral (!) 108 20 --  5\' 10"  (1.778 m) 94.3 kg    Physical exam: General: Well nourished, well developed female in no acute distress. Abdomen: gravid (size consistent with 28 wk dates) Cardiovascular: S1 and S2 normal Respiratory: CTAB, cough noted Extremities: no clubbing, cyanosis or edema Skin: Warm and dry.   Medications: Current Facility-Administered Medications  Medication Dose Route Frequency Provider Last Rate Last Admin  . 0.9 %  sodium chloride infusion   Intravenous Continuous Malachy Mood, MD 150 mL/hr at 11/14/20 0753 Infusion Verify at 11/14/20 0753  . acetaminophen (TYLENOL) tablet 650 mg  650 mg Oral Q4H PRN Malachy Mood, MD   650 mg at 11/13/20 1554  . calcium carbonate (TUMS - dosed in mg elemental calcium) chewable tablet 400 mg of elemental calcium  400 mg of elemental calcium Oral TID PRN Malachy Mood, MD   400 mg of elemental calcium at 11/13/20 2045  . diphenhydrAMINE (BENADRYL) injection 12.5 mg  12.5 mg Intravenous Q6H PRN Malachy Mood, MD       Or  .  diphenhydrAMINE (BENADRYL) 12.5 MG/5ML elixir 12.5 mg  12.5 mg Oral Q6H PRN Malachy Mood, MD      . HYDROmorphone (DILAUDID) 1 mg/mL PCA injection   Intravenous Q4H Malachy Mood, MD   25 mg at 11/13/20 1608  . lactated ringers infusion   Intravenous Continuous Imagene Riches, CNM   Stopped at 11/13/20 1553  . naloxone [facility]) injection 0.4 mg  0.4 mg Intravenous PRN Malachy Mood, MD       And  . sodium chloride flush (NS) 0.9 % injection 9 mL  9 mL Intravenous PRN Malachy Mood, MD      . ondansetron (ZOFRAN) injection 4 mg  4 mg Intravenous Q6H PRN Malachy Mood, MD   4 mg at 11/14/20 1051  . prenatal multivitamin tablet 1 tablet  1 tablet Oral Q1200 Malachy Mood, MD        Labs:  Recent Labs  Lab 11/13/20 1303 11/14/20 0924  WBC 6.9 5.8  HGB 11.7* 11.7*  HCT 34.0* 35.0*  PLT 179 171    Recent Labs  Lab 11/13/20 1303 11/14/20 0924  NA 135 138  K 3.5 3.8  CL 104 107  CO2 21* 20*  BUN 8 9  CREATININE 0.60 0.60  CALCIUM 8.5* 8.6*  PROT 6.1*  --   BILITOT 0.7  --   ALKPHOS 53  --   ALT 14  --   AST 18  --   GLUCOSE 85 74     Radiology:  EXAM: RENAL / URINARY TRACT ULTRASOUND COMPLETE COMPARISON:  None. FINDINGS: Right Kidney: Renal measurements: 10.8 x 5.7 x 7.2 cm = volume: 228 mL. Echogenicity within normal limits. 6 mm nonshadowing echogenic focus in the proximal ureter with mild hydronephrosis. No mass visualized. Left Kidney: Renal measurements: 11.4 x 5.7 x 5.1 cm = volume: 172 mL. Echogenicity within normal limits. No mass or hydronephrosis visualized. Bladder: Appears normal for degree of bladder distention. The right ureteral jet is not visualized. Other: None. IMPRESSION: 1. Mild right hydronephrosis with possible 6 mm calculus in the proximal ureter  Assessment & Plan:  30 yo [id number] at [redacted]w[redacted]d by LMP admitted for acute flanke pain with ureteral stone and COVID-19 infection - mild disease presentation.  *Ureteral  stone  -Continue IVF and current pain control regimen/antiemetics  -Strain urine  -Monitor for symptom progression  *Fetal status reassuring  -+FM  -FHT: 128 bmp *Dispo: Continue to monitor for improvement in symptoms. Discussed with patient  possibility that stone might not pass during admission. Will monitor for pain to be well-controlled and discharge when indicated.

## 2020-11-14 NOTE — Progress Notes (Signed)
Called into patient room. Patient had passed a stone or sediment and she reported that she was feeling less pain.  However, she now reports nausea. Received zofran a few hours ago. Will add phenergan. Patient would like to see how she does without the PCA overnight. Will access pain control again in the AM. IV pushes for pain if needed. Patient does not feel she can tolerate PO pain medicine at this time because of her nausea.  Continue to monitor. Access in AM.   Adelene Idler MD, [facility], [facility] 11/14/2020 10:26 PM

## 2020-11-15 DIAGNOSIS — U071 COVID-19: Secondary | ICD-10-CM

## 2020-11-15 DIAGNOSIS — N200 Calculus of kidney: Secondary | ICD-10-CM

## 2020-11-15 DIAGNOSIS — R109 Unspecified abdominal pain: Secondary | ICD-10-CM

## 2020-11-15 DIAGNOSIS — O98513 Other viral diseases complicating pregnancy, third trimester: Secondary | ICD-10-CM

## 2020-11-15 DIAGNOSIS — Z3A28 28 weeks gestation of pregnancy: Secondary | ICD-10-CM

## 2020-11-15 DIAGNOSIS — O26833 Pregnancy related renal disease, third trimester: Principal | ICD-10-CM

## 2020-11-15 MED ORDER — PROMETHAZINE HCL 25 MG PO TABS: 25.0000 mg | ORAL_TABLET | Freq: Four times a day (QID) | ORAL | 2 refills | Status: DC | PRN

## 2020-11-15 MED ORDER — HYDROCODONE-ACETAMINOPHEN 5-325 MG PO TABS: 1.0000 | ORAL_TABLET | Freq: Four times a day (QID) | ORAL | 0 refills | Status: DC | PRN

## 2020-11-15 MED ORDER — ONDANSETRON 4 MG PO TBDP: 4.0000 mg | ORAL_TABLET | Freq: Four times a day (QID) | ORAL | 0 refills | Status: AC | PRN

## 2020-11-15 NOTE — Discharge Summary (Signed)
Physician Discharge Summary  Patient ID: Sherri Lloyd MRN: [id number] DOB/AGE: 03-19-1991 30 y.o.  Admit date: 11/13/2020 Discharge date: 11/15/2020  Admission Diagnoses:Principal Problem:   Pregnancy complicated by nephrolithiasis in third trimester, antepartum Active Problems:   Abdominal pain in pregnancy, third trimester   Acute flank pain  Discharge Diagnoses:  Principal Problem:   Pregnancy complicated by nephrolithiasis in third trimester, antepartum Active Problems:   Abdominal pain in pregnancy, third trimester   Acute flank pain   Discharged Condition: good  Hospital Course: Pt seen and examined, for Right Flank Pain associated with nausea and vomiting.  Found to have right sided kidney stone.  Urine straining has revealed some fragments.  Nausea and vomiting as well as pain has improved.  Urology has seen patient.  Plan for discharge outpatient management.    Consults: urology  Significant Diagnostic Studies: fetal well being reassuring  Treatments: IV hydration  Discharge Exam: Blood pressure (!) 101/50, pulse 65, temperature 97.8 F (36.6 C), temperature source Oral, resp. rate 16, height 5\' 10"  (1.778 m), weight 94.3 kg, last menstrual period 04/27/2020, SpO2 100 %. General appearance: alert, cooperative and no distress Resp: clear to auscultation bilaterally Cardio: regular rate and rhythm, S1, S2 normal, no murmur, click, rub or gallop GI: soft, non-tender; bowel sounds normal; no masses,  no organomegaly and FHT 140s Extremities: extremities normal, atraumatic, no cyanosis or edema Skin: Skin color, texture, turgor normal. No rashes or lesions Neurologic: Grossly normal  Disposition: Discharge disposition: 01-Home or Self Care       Discharge Instructions    Call MD for:   Complete by: As directed    Worsening contractions or pain; leakage of fluid; bleeding.   Call MD for:  persistant nausea and vomiting   Complete by: As directed    Call MD  for:  severe uncontrolled pain   Complete by: As directed    Diet general   Complete by: As directed    Increase activity slowly   Complete by: As directed      Allergies as of 11/15/2020   No Known Allergies     Medication List    TAKE these medications   HYDROcodone-acetaminophen 5-325 MG tablet Commonly known as: Norco Take 1 tablet by mouth every 6 (six) hours as needed for moderate pain.   ondansetron 4 MG disintegrating tablet Commonly known as: Zofran ODT Take 1 tablet (4 mg total) by mouth every 6 (six) hours as needed for nausea.   prenatal multivitamin Tabs tablet Take 1 tablet by mouth daily at 12 noon.   promethazine 25 MG tablet Commonly known as: PHENERGAN Take 1 tablet (25 mg total) by mouth every 6 (six) hours as needed for nausea or vomiting.       Follow-up Information    [facility], [facility]. Go in 2 week(s).   Contact information: [address] [place] [phone]             A total of 35 minutes were spent face-to-face with the patient as well as preparation, review, communication, and documentation during this encounter.   Signed: [phone] 11/15/2020, 10:31 AM

## 2020-11-15 NOTE — Progress Notes (Signed)
Discharge orders received from MD. RN reviewed discharge instructions with patient and explained that [facility] will call her Monday to schedule her next OB appointment. Pt has no questions at this time and discharged home in stable condition.

## 2020-11-15 NOTE — Progress Notes (Signed)
RN at bedside. VSS. Pt denies any pain but does state she has some ongoing nausea that "feels like mucus in the back of my throat." Pt denies any episodes of emesis. RN strained 800 cc of of pale yellow, clear urine but no stone fragments were noted. Pt did pass two very small stone fragments during the night. Pt denies any othe needs at this time.

## 2020-11-15 NOTE — Discharge Instructions (Signed)

## 2020-11-17 ENCOUNTER — Telehealth: Payer: Self-pay | Admitting: Obstetrics & Gynecology

## 2020-11-17 NOTE — Telephone Encounter (Signed)
Called and spoke with patient about scheduling follow up. Patient reports she is already been seen for care else where and would prefer to follow up with them. Patient didn't want to schedule follow up at this time.

## 2020-11-17 NOTE — Telephone Encounter (Signed)
-----   Message from Nadara Mustard, MD sent at 11/15/2020 10:30 AM EST ----- Regarding: Reschedule ROB From 1/12 to after 11/23/20 due to covid pos test

## 2021-03-27 ENCOUNTER — Other Ambulatory Visit: Payer: Medicaid Other

## 2021-10-21 ENCOUNTER — Emergency Department
Admission: EM | Admit: 2021-10-21 | Discharge: 2021-10-21 | Disposition: A | Discharge status: Home / Self Care | Payer: Medicaid Other | Attending: Emergency Medicine | Admitting: Emergency Medicine

## 2021-10-21 ENCOUNTER — Emergency Department: Payer: Medicaid Other

## 2021-10-21 ENCOUNTER — Other Ambulatory Visit: Payer: Self-pay

## 2021-10-21 DIAGNOSIS — R109 Unspecified abdominal pain: Secondary | ICD-10-CM | POA: Diagnosis present

## 2021-10-21 DIAGNOSIS — N939 Abnormal uterine and vaginal bleeding, unspecified: Secondary | ICD-10-CM

## 2021-10-21 DIAGNOSIS — F17200 Nicotine dependence, unspecified, uncomplicated: Secondary | ICD-10-CM | POA: Diagnosis not present

## 2021-10-21 DIAGNOSIS — R102 Pelvic and perineal pain: Secondary | ICD-10-CM

## 2021-10-21 LAB — CBC
HCT: 42.9 % (ref 36.0–46.0)
Hemoglobin: 14.6 g/dL (ref 12.0–15.0)
MCH: 29.5 pg (ref 26.0–34.0)
MCHC: 34 g/dL (ref 30.0–36.0)
MCV: 86.7 fL (ref 80.0–100.0)
Platelets: 191 10*3/uL (ref 150–400)
RBC: 4.95 MIL/uL (ref 3.87–5.11)
RDW: 14.1 % (ref 11.5–15.5)
WBC: 11.4 10*3/uL — ABNORMAL HIGH (ref 4.0–10.5)
nRBC: 0 % (ref 0.0–0.2)

## 2021-10-21 LAB — WET PREP, GENITAL
Clue Cells Wet Prep HPF POC: NONE SEEN
Sperm: NONE SEEN
Trich, Wet Prep: NONE SEEN
WBC, Wet Prep HPF POC: <10 (ref <10)
Yeast Wet Prep HPF POC: NONE SEEN

## 2021-10-21 LAB — COMPREHENSIVE METABOLIC PANEL
ALT: 11 U/L (ref 0–44)
AST: 15 U/L (ref 15–41)
Albumin: 3.9 g/dL (ref 3.5–5.0)
Alkaline Phosphatase: 68 U/L (ref 38–126)
Anion gap: 5 (ref 5–15)
BUN: 10 mg/dL (ref 6–20)
CO2: 23 mmol/L (ref 22–32)
Calcium: 8.8 mg/dL — ABNORMAL LOW (ref 8.9–10.3)
Chloride: 107 mmol/L (ref 98–111)
Creatinine, Ser: 0.7 mg/dL (ref 0.44–1.00)
GFR, Estimated: >60 mL/min (ref >60)
Glucose, Bld: 89 mg/dL (ref 70–99)
Potassium: 4 mmol/L (ref 3.5–5.1)
Sodium: 135 mmol/L (ref 135–145)
Total Bilirubin: 0.6 mg/dL (ref 0.3–1.2)
Total Protein: 7.5 g/dL (ref 6.5–8.1)

## 2021-10-21 LAB — LIPASE, BLOOD: Lipase: 22 U/L (ref 11–51)

## 2021-10-21 LAB — URINALYSIS, ROUTINE W REFLEX MICROSCOPIC
Bilirubin Urine: NEGATIVE
Glucose, UA: NEGATIVE mg/dL
Hgb urine dipstick: NEGATIVE
Leukocytes,Ua: NEGATIVE
Nitrite: NEGATIVE
Protein, ur: NEGATIVE mg/dL
Specific Gravity, Urine: 1.025 (ref 1.005–1.030)
pH: 6.5 (ref 5.0–8.0)

## 2021-10-21 LAB — CHLAMYDIA/NGC RT PCR (ARMC ONLY)
Chlamydia Tr: NOT DETECTED
N gonorrhoeae: NOT DETECTED

## 2021-10-21 LAB — TYPE AND SCREEN
ABO/RH(D): O POS
Antibody Screen: NEGATIVE

## 2021-10-21 LAB — POC URINE PREG, ED: Preg Test, Ur: NEGATIVE

## 2021-10-21 MED ORDER — KETOROLAC TROMETHAMINE 60 MG/2ML IM SOLN
30.0000 mg | Freq: Once | INTRAMUSCULAR | Status: AC
  Administered 2021-10-21: 11:00:00 30 mg via INTRAMUSCULAR
  Filled 2021-10-21: qty 2

## 2021-10-21 MED ORDER — ACETAMINOPHEN 500 MG PO TABS
1000.0000 mg | ORAL_TABLET | Freq: Once | ORAL | Status: AC
  Administered 2021-10-21: 11:00:00 1000 mg via ORAL
  Filled 2021-10-21: qty 2

## 2021-10-21 NOTE — ED Notes (Signed)
Patient complained of having a painful bowel movement that looked like a blood clot. Patient states she is having a lot of abdominal pain at this time.

## 2021-10-21 NOTE — ED Triage Notes (Signed)
Pt to ED for lower abd pain that started at 0630 this am, describes as cramping. Reports had BM with blood clot in it this am. Denies dark stools. Reports nausea after having pain. Tearful in triage.

## 2021-10-21 NOTE — ED Notes (Signed)
Sent rainbow with T&S to lab. ?

## 2021-10-21 NOTE — ED Provider Notes (Signed)
[facility] Emergency Department Provider Note  ____________________________________________   Event Date/Time   First MD Initiated Contact with Patient 10/21/21 1046     (approximate)  I have reviewed the triage vital signs and the nursing notes.   HISTORY  Chief Complaint Abdominal Pain and GI Bleeding   HPI Sherri Lloyd is a 30 y.o. female with past medical history of kidney stone who presents for assessment of some acute lower abdominal crampy pain associate with some vaginal bleeding and nausea that occurred earlier this morning.  She describes the bleeding of passage of a blood clot but has not had any subsequent bleeding.  Patient states started around 6:30 AM when she took some Motrin does not feel this helped much.  She had a little bit of nausea with the pain but otherwise is not any vomiting, chest pain, cough, shortness of breath, back pain, upper abdominal pain, headache, earache, sore throat or other recent episodes of vaginal bleeding or abnormal discharge.  She denies any burning with urination or blood in her stool constipation or dark melanotic stools.  She is not currently on birth control and has not been sexually active in several weeks.  She has no known history of fibroids or ovarian cyst.         Past Medical History:  Diagnosis Date   Medical history non-contributory     Patient Active Problem List   Diagnosis Date Noted   Pregnancy complicated by nephrolithiasis in third trimester, antepartum    Acute flank pain    Abdominal pain in pregnancy, third trimester 11/13/2020    Past Surgical History:  Procedure Laterality Date   TONSILLECTOMY      Prior to Admission medications   Medication Sig Start Date End Date Taking? Authorizing Provider  ondansetron (ZOFRAN ODT) 4 MG disintegrating tablet Take 1 tablet (4 mg total) by mouth every 6 (six) hours as needed for nausea. 11/15/20   Nadara Mustard, MD     Allergies Patient has no known allergies.  No family history on file.  Social History Social History   Tobacco Use   Smoking status: Every Day   Smokeless tobacco: Never  Substance Use Topics   Alcohol use: Not Currently   Drug use: Yes    Types: Marijuana    Review of Systems  Review of Systems  Constitutional:  Negative for chills and fever.  HENT:  Negative for sore throat.   Eyes:  Negative for pain.  Respiratory:  Negative for cough and stridor.   Cardiovascular:  Negative for chest pain.  Gastrointestinal:  Positive for abdominal pain. Negative for vomiting.  Genitourinary:  Negative for dysuria.  Musculoskeletal:  Negative for myalgias.  Skin:  Negative for rash.  Neurological:  Negative for seizures, loss of consciousness and headaches.  Psychiatric/Behavioral:  Negative for suicidal ideas.   All other systems reviewed and are negative.    ____________________________________________   PHYSICAL EXAM:  VITAL SIGNS: ED Triage Vitals  Enc Vitals Group     BP 10/21/21 0847 (!) 148/86     Pulse Rate 10/21/21 0847 87     Resp 10/21/21 0847 17     Temp 10/21/21 0847 98.7 F (37.1 C)     Temp Source 10/21/21 0847 Oral     SpO2 10/21/21 0847 95 %     Weight 10/21/21 0905 208 lb (94.3 kg)     Height 10/21/21 0905 5\' 10"  (1.778 m)     Head Circumference --  Peak Flow --      Pain Score 10/21/21 0905 8     Pain Loc --      Pain Edu? --      Excl. in GC? --    Vitals:   10/21/21 0847  BP: (!) 148/86  Pulse: 87  Resp: 17  Temp: 98.7 F (37.1 C)  SpO2: 95%   Physical Exam Vitals and nursing note reviewed.  Constitutional:      General: She is not in acute distress.    Appearance: She is well-developed.  HENT:     Head: Normocephalic and atraumatic.  Eyes:     Conjunctiva/sclera: Conjunctivae normal.  Cardiovascular:     Rate and Rhythm: Normal rate and regular rhythm.     Heart sounds: No murmur heard. Pulmonary:     Effort: Pulmonary  effort is normal. No respiratory distress.     Breath sounds: Normal breath sounds.  Abdominal:     Palpations: Abdomen is soft.     Tenderness: There is abdominal tenderness in the suprapubic area. There is no right CVA tenderness or left CVA tenderness.  Musculoskeletal:        General: No swelling.     Cervical back: Neck supple.  Skin:    General: Skin is warm and dry.     Capillary Refill: Capillary refill takes less than 2 seconds.  Neurological:     Mental Status: She is alert.  Psychiatric:        Mood and Affect: Mood normal.    Pelvic exam remarkable for closed cervical os without any active bleeding noted.  No significant friability or erythema.  There is a little bit of white discharge noted. ____________________________________________   LABS (all labs ordered are listed, but only abnormal results are displayed)  Labs Reviewed  COMPREHENSIVE METABOLIC PANEL - Abnormal; Notable for the following components:      Result Value   Calcium 8.8 (*)    All other components within normal limits  CBC - Abnormal; Notable for the following components:   WBC 11.4 (*)    All other components within normal limits  URINALYSIS, ROUTINE W REFLEX MICROSCOPIC - Abnormal; Notable for the following components:   Ketones, ur TRACE (*)    All other components within normal limits  WET PREP, GENITAL  CHLAMYDIA/NGC RT PCR ([facility] ONLY)            LIPASE, BLOOD  POC URINE PREG, ED  TYPE AND SCREEN   ____________________________________________  EKG  ____________________________________________  RADIOLOGY  ED MD interpretation: Pelvic ultrasound shows some trace free fluid likely physiologic without evidence of torsion, ovarian cyst adnexal or uterine mass or other acute abnormalities.  Official radiology report(s): US PELVIC COMPLETE W TRANSVAGINAL AND TORSION R/O  Result Date: 10/21/2021 CLINICAL DATA:  Pelvic pain and bleeding.  Currently breast-feeding. EXAM: TRANSABDOMINAL  AND TRANSVAGINAL ULTRASOUND OF PELVIS DOPPLER ULTRASOUND OF OVARIES TECHNIQUE: Both transabdominal and transvaginal ultrasound examinations of the pelvis were performed. Transabdominal technique was performed for global imaging of the pelvis including uterus, ovaries, adnexal regions, and pelvic cul-de-sac. It was necessary to proceed with endovaginal exam following the transabdominal exam to visualize the adnexa. Color and duplex Doppler ultrasound was utilized to evaluate blood flow to the ovaries. COMPARISON:  None. FINDINGS: Uterus Measurements: 9.0 x 4.0 x 5.5 cm = volume: 113 mL. No fibroids or other mass visualized. Endometrium Thickness: 16 mm.  No focal abnormality visualized. Right ovary Measurements: 2.3 x 2.7 x 2.7 cm =  volume: 9 mL. Normal appearance/no adnexal mass. Left ovary Measurements: 2.4 x 1.5 x 1.7 cm = volume: 3 mL. Normal appearance/no adnexal mass. Pulsed Doppler evaluation of both ovaries demonstrates normal low-resistance arterial and venous waveforms. Other findings Small amount of simple appearing free fluid in the pelvis. IMPRESSION: 1. Small amount of simple appearing free fluid in the pelvis, likely physiologic. Otherwise normal pelvic ultrasound. Electronically Signed   By: Obie Dredge M.D.   On: 10/21/2021 12:07    ____________________________________________   PROCEDURES  Procedure(s) performed (including Critical Care):  Procedures   ____________________________________________   INITIAL IMPRESSION / ASSESSMENT AND PLAN / ED COURSE        Patient presents with above-stated history and exam for assessment of some lower abdominal pain associate with passage of a blood clot from the vagina earlier this morning.  On arrival he is in retention with otherwise stable vital signs on room air.  On arrival she is hypertensive with otherwise stable vital signs on room air.  She does have some mild suprapubic discomfort on pelvic exam has no active bleeding friability  or significant erythema but does have some white discharge.  Differential considerations include abnormal bleeding from possible fibroids, hormonal imbalance, versus possible miscarriage.  Less likely source of the bleeding or lower pelvic pain could also be associated with ovarian cyst or torsion.  Pregnancy test is negative not consistent with miscarriage or ectopic.  CBC shows slight leukocytosis with WBC count of 11.4 without evidence of acute anemia and normal platelets.  Lipase not consistent with acute pancreatitis.  CMP without evidence of significant electrolyte or metabolic derangements and no evidence of hepatitis or cholestasis.  Wet prep and GC studies are negative.  Overall exam work-up is not consistent with PID or TOA.  Pelvic ultrasound shows some trace free fluid likely physiologic without evidence of torsion, ovarian cyst adnexal or uterine mass or other acute abnormalities.  Unclear etiology of patient's bleeding earlier today although states her pain is much improved after some Toradol.  Given improvement in symptoms with otherwise reassuring exam work-up I think she is stable for discharge close outpatient OB follow-up.  Discharged in stable condition.  Strict return precautions advised and discussed.     ____________________________________________   FINAL CLINICAL IMPRESSION(S) / ED DIAGNOSES  Final diagnoses:  Pelvic pain  Abnormal vaginal bleeding    Medications  acetaminophen (TYLENOL) tablet 1,000 mg (1,000 mg Oral Given 10/21/21 1108)  ketorolac (TORADOL) injection 30 mg (30 mg Intramuscular Given 10/21/21 1108)     ED Discharge Orders     None        Note:  This document was prepared using Dragon voice recognition software and may include unintentional dictation errors.    Gilles Chiquito, MD 10/21/21 [phone]

## 2022-04-18 IMAGING — US US PELVIS COMPLETE TRANSABD/TRANSVAG W DUPLEX
1 series · 15 of 25 positions shown · non-contrast
Comparison: None.

CLINICAL DATA: Pelvic pain and bleeding.  Currently breast-feeding.

EXAM:
TRANSABDOMINAL AND TRANSVAGINAL ULTRASOUND OF PELVIS
DOPPLER ULTRASOUND OF OVARIES
TECHNIQUE: Both transabdominal and transvaginal ultrasound examinations of the
pelvis were performed. Transabdominal technique was performed for
global imaging of the pelvis including uterus, ovaries, adnexal
regions, and pelvic cul-de-sac.
It was necessary to proceed with endovaginal exam following the
transabdominal exam to visualize the adnexa. Color and duplex
Doppler ultrasound was utilized to evaluate blood flow to the
ovaries.

[Series 1: us transvaginal non-ob · 15 of 70 slices shown]
[im 1/70]
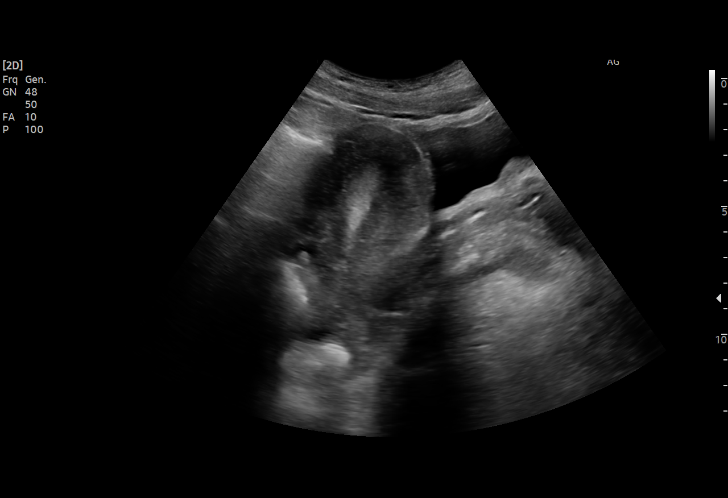
[im 6/70]
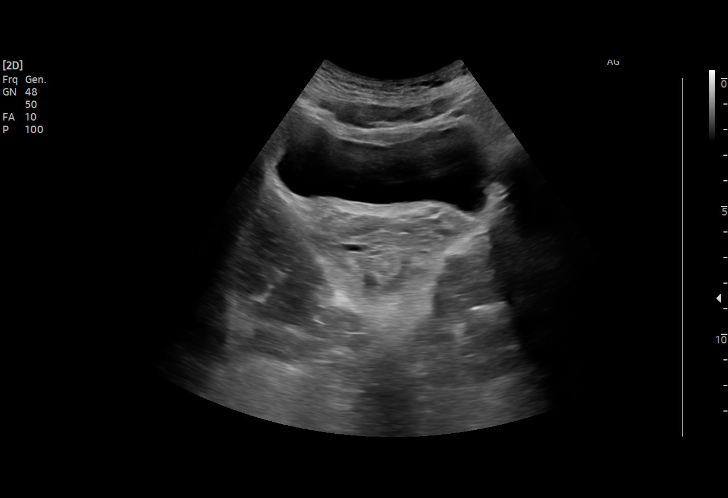
[im 12/70]
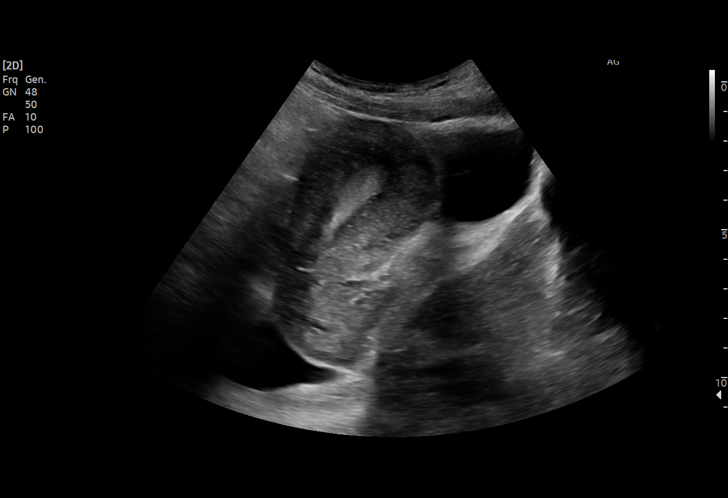
[im 15/70]
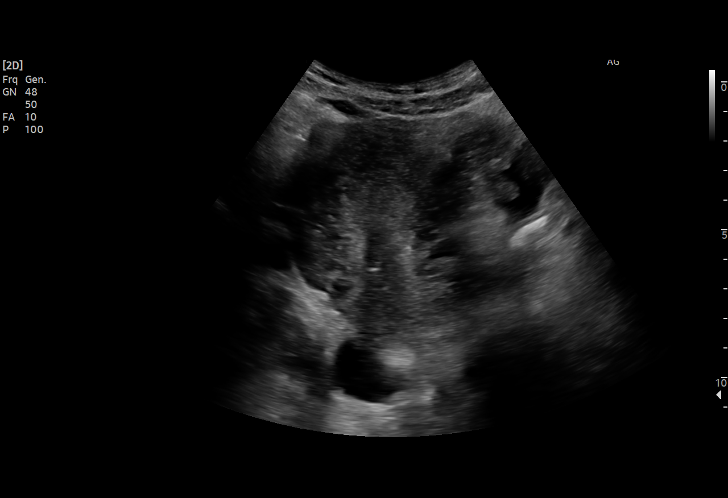
[im 21/70]
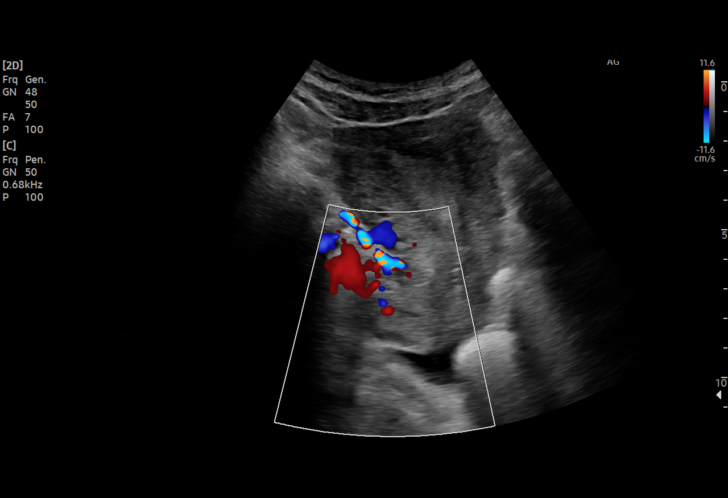
[im 26/70]
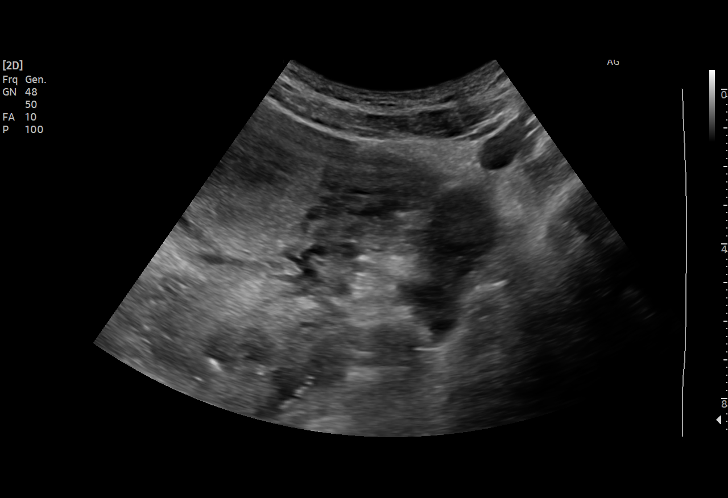
[im 29/70]
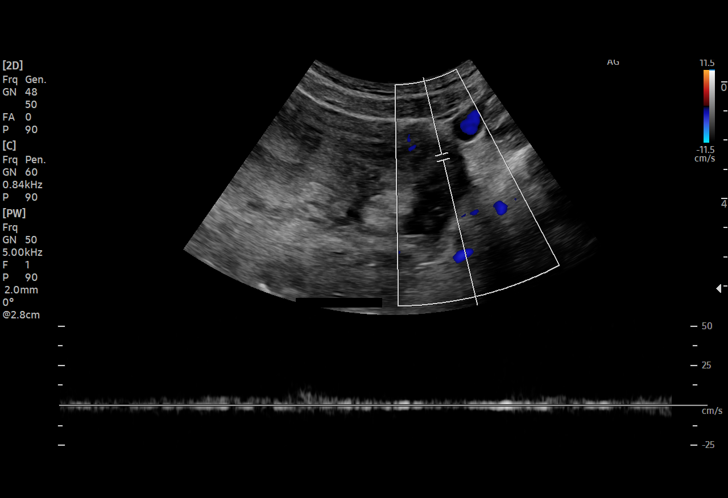
[im 35/70]
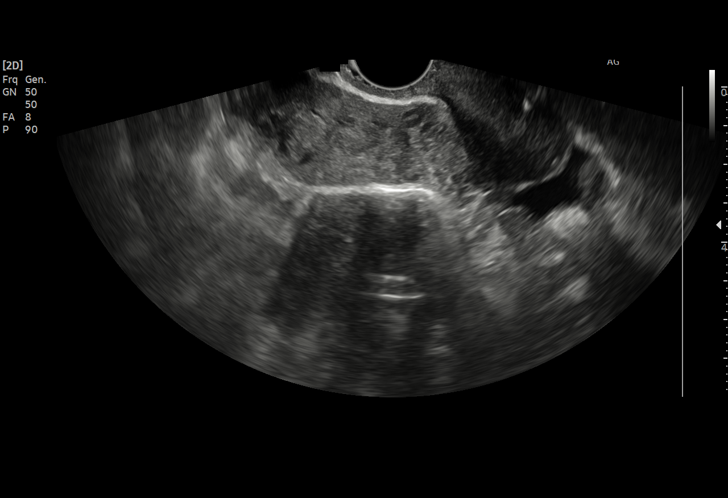
[im 41/70]
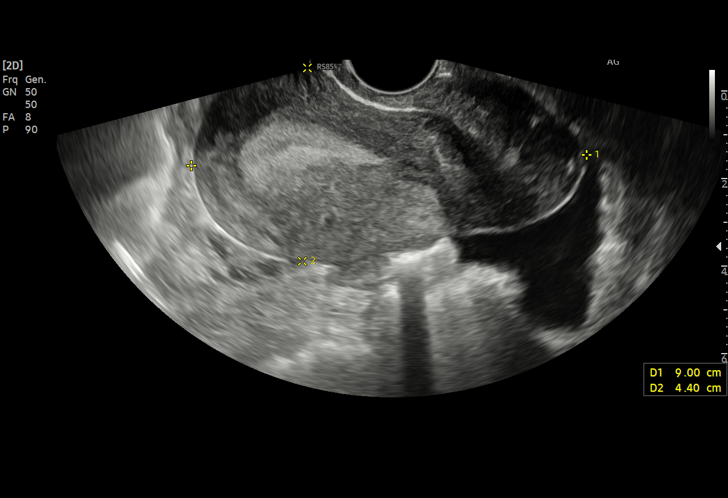
[im 44/70]
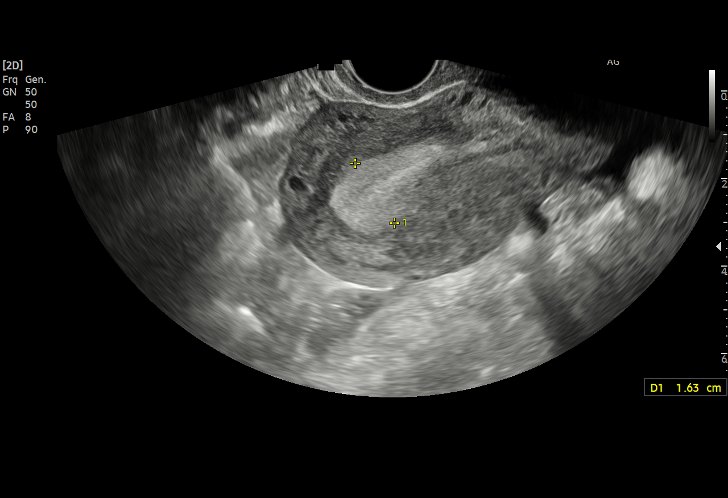
[im 49/70]
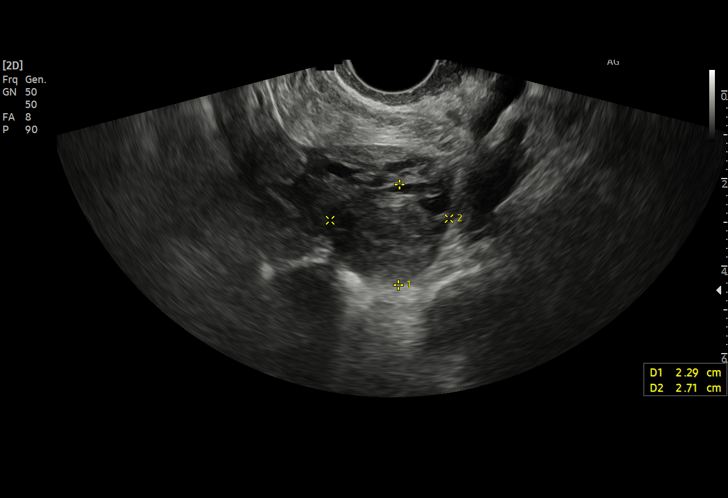
[im 55/70]
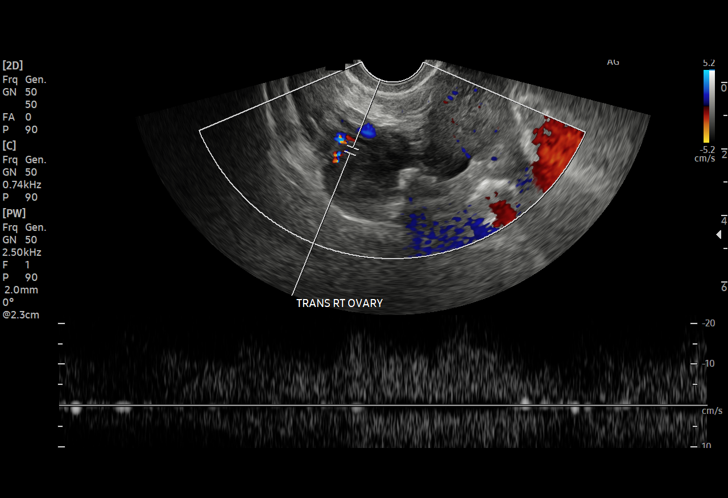
[im 58/70]
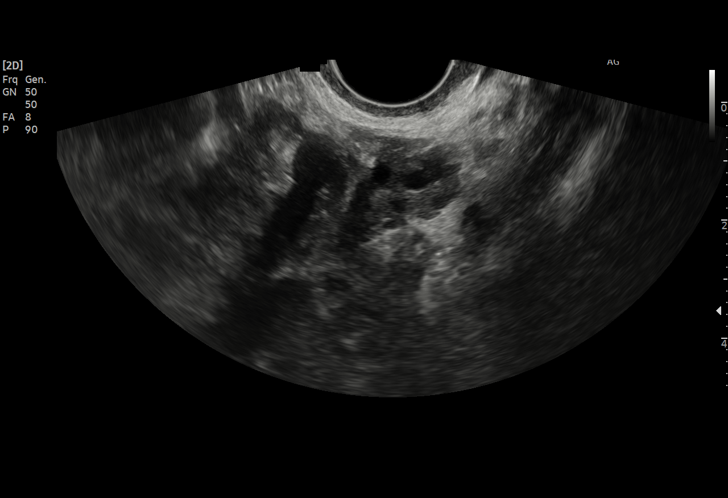
[im 64/70]
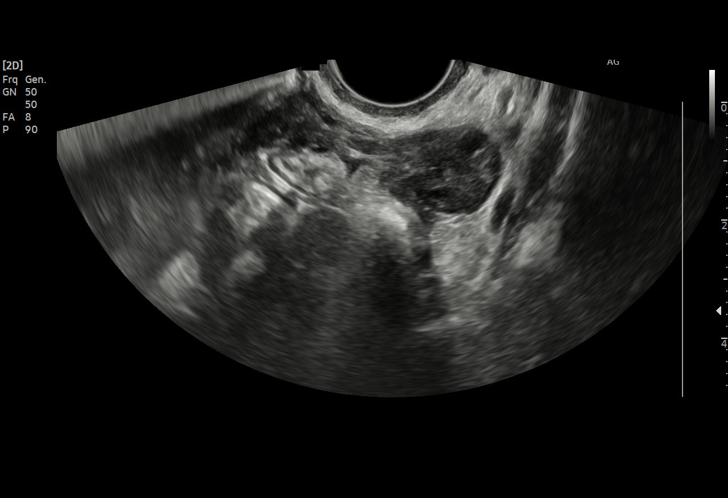
[im 70/70]
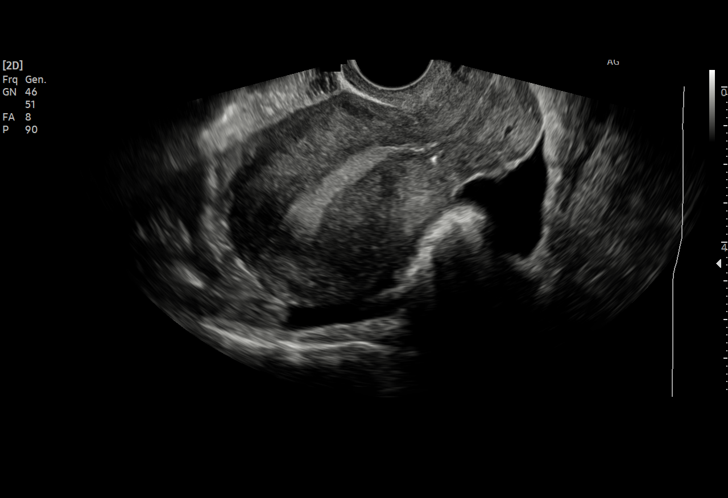

[15 of 25 positions shown; findings below may reference images not displayed]

FINDINGS: Uterus

Measurements: 9.0 x 4.0 x 5.5 cm = volume: 113 mL. No fibroids or
other mass visualized.

Endometrium

Thickness: 16 mm.  No focal abnormality visualized.

Right ovary

Measurements: 2.3 x 2.7 x 2.7 cm = volume: 9 mL. Normal
appearance/no adnexal mass.

Left ovary

Measurements: 2.4 x 1.5 x 1.7 cm = volume: 3 mL. Normal
appearance/no adnexal mass.

Pulsed Doppler evaluation of both ovaries demonstrates normal
low-resistance arterial and venous waveforms.

Other findings

Small amount of simple appearing free fluid in the pelvis.
IMPRESSION: 1. Small amount of simple appearing free fluid in the pelvis, likely
physiologic. Otherwise normal pelvic ultrasound.
